# Patient Record
Sex: Female | Born: 1975 | Race: White | Hispanic: No | Marital: Married | Smoking: Former smoker
Health system: Southern US, Community
[De-identification: ages and names within clinical notes are randomized; demographics above are authoritative.]

## PROBLEM LIST (undated history)

## (undated) DIAGNOSIS — H269 Unspecified cataract: Secondary | ICD-10-CM

## (undated) DIAGNOSIS — H9193 Unspecified hearing loss, bilateral: Secondary | ICD-10-CM

## (undated) DIAGNOSIS — R011 Cardiac murmur, unspecified: Secondary | ICD-10-CM

## (undated) DIAGNOSIS — H547 Unspecified visual loss: Secondary | ICD-10-CM

## (undated) DIAGNOSIS — H409 Unspecified glaucoma: Secondary | ICD-10-CM

## (undated) DIAGNOSIS — Q150 Congenital glaucoma: Secondary | ICD-10-CM

## (undated) HISTORY — PX: EYE SURGERY: SHX253

## (undated) HISTORY — PX: HERNIA REPAIR: SHX51

## (undated) HISTORY — DX: Unspecified glaucoma: H40.9

## (undated) HISTORY — DX: Unspecified cataract: H26.9

## (undated) HISTORY — DX: Cardiac murmur, unspecified: R01.1

## (undated) HISTORY — DX: Congenital glaucoma: Q15.0

---

## 2000-03-13 ENCOUNTER — Encounter: Admission: RE | Admit: 2000-03-13 | Discharge: 2000-03-13 | Payer: Self-pay | Admitting: Family Medicine

## 2000-04-02 ENCOUNTER — Other Ambulatory Visit: Admission: RE | Admit: 2000-04-02 | Discharge: 2000-04-02 | Payer: Self-pay | Admitting: *Deleted

## 2000-04-02 ENCOUNTER — Encounter: Admission: RE | Admit: 2000-04-02 | Discharge: 2000-04-02 | Payer: Self-pay | Admitting: Family Medicine

## 2000-04-24 ENCOUNTER — Encounter: Payer: Self-pay | Admitting: Sports Medicine

## 2000-04-24 ENCOUNTER — Encounter: Admission: RE | Admit: 2000-04-24 | Discharge: 2000-04-24 | Payer: Self-pay | Admitting: Sports Medicine

## 2000-04-29 ENCOUNTER — Encounter: Admission: RE | Admit: 2000-04-29 | Discharge: 2000-04-29 | Payer: Self-pay | Admitting: Sports Medicine

## 2000-05-15 ENCOUNTER — Ambulatory Visit (HOSPITAL_COMMUNITY): Admission: RE | Admit: 2000-05-15 | Discharge: 2000-05-15 | Payer: Self-pay | Admitting: Sports Medicine

## 2000-05-22 ENCOUNTER — Encounter: Admission: RE | Admit: 2000-05-22 | Discharge: 2000-05-22 | Payer: Self-pay | Admitting: Family Medicine

## 2000-06-04 ENCOUNTER — Encounter: Admission: RE | Admit: 2000-06-04 | Discharge: 2000-06-04 | Payer: Self-pay | Admitting: Family Medicine

## 2002-09-24 ENCOUNTER — Encounter: Admission: RE | Admit: 2002-09-24 | Discharge: 2002-09-24 | Payer: Self-pay | Admitting: Family Medicine

## 2003-12-28 ENCOUNTER — Ambulatory Visit: Payer: Self-pay | Admitting: Family Medicine

## 2004-01-04 ENCOUNTER — Ambulatory Visit: Payer: Self-pay | Admitting: Family Medicine

## 2006-05-22 DIAGNOSIS — I341 Nonrheumatic mitral (valve) prolapse: Secondary | ICD-10-CM | POA: Insufficient documentation

## 2006-05-22 DIAGNOSIS — I059 Rheumatic mitral valve disease, unspecified: Secondary | ICD-10-CM | POA: Insufficient documentation

## 2006-05-22 DIAGNOSIS — H269 Unspecified cataract: Secondary | ICD-10-CM

## 2006-05-22 DIAGNOSIS — I359 Nonrheumatic aortic valve disorder, unspecified: Secondary | ICD-10-CM | POA: Insufficient documentation

## 2006-05-22 DIAGNOSIS — H9190 Unspecified hearing loss, unspecified ear: Secondary | ICD-10-CM | POA: Insufficient documentation

## 2006-05-22 DIAGNOSIS — I459 Conduction disorder, unspecified: Secondary | ICD-10-CM | POA: Insufficient documentation

## 2006-05-22 DIAGNOSIS — H409 Unspecified glaucoma: Secondary | ICD-10-CM | POA: Insufficient documentation

## 2006-05-23 ENCOUNTER — Encounter (INDEPENDENT_AMBULATORY_CARE_PROVIDER_SITE_OTHER): Payer: Self-pay | Admitting: *Deleted

## 2006-12-24 ENCOUNTER — Encounter (INDEPENDENT_AMBULATORY_CARE_PROVIDER_SITE_OTHER): Payer: Self-pay | Admitting: *Deleted

## 2006-12-24 LAB — CONVERTED CEMR LAB

## 2019-07-16 ENCOUNTER — Ambulatory Visit (INDEPENDENT_AMBULATORY_CARE_PROVIDER_SITE_OTHER): Payer: Medicare Other | Admitting: Family Medicine

## 2019-07-16 ENCOUNTER — Encounter: Payer: Self-pay | Admitting: Family Medicine

## 2019-07-16 ENCOUNTER — Other Ambulatory Visit: Payer: Self-pay

## 2019-07-16 VITALS — BP 123/79 | HR 71 | Temp 97.7°F | Ht 64.0 in | Wt 190.0 lb

## 2019-07-16 DIAGNOSIS — B078 Other viral warts: Secondary | ICD-10-CM

## 2019-07-16 DIAGNOSIS — Z8742 Personal history of other diseases of the female genital tract: Secondary | ICD-10-CM

## 2019-07-16 NOTE — Patient Instructions (Addendum)
I will refer you to obgyn.   Nice meeting you today.    Cryosurgery for Skin Conditions Cryosurgery, also called cryotherapy, is the use of extremely cold liquid (liquid nitrogen) to freeze and remove abnormal or diseased tissue. Cryosurgery may be used to remove certain growths on the skin, such as:  Warts.  Skin sores that could turn into cancer (precancerous skin lesions or actinic keratoses).  Some skin cancers. Cryosurgery usually takes a few minutes, and it can be done in your health care provider's office. Tell a health care provider about:  Any allergies you have.  All medicines you are taking, including vitamins, herbs, eye drops, creams, and over-the-counter medicines.  Any problems you or family members have had with anesthetic medicines.  Any blood disorders you have.  Any surgeries you have had.  Any medical conditions you have.  Whether you are pregnant or may be pregnant. What are the risks? Generally, this is a safe procedure. However, problems may occur, including:  Infection.  Bleeding.  Scarring.  Changes in skin color (lighter or darker than normal skin tone).  Swelling.  Hair loss in the treated area.  Damage to nearby structures or organs, such as nerve damage and loss of feeling. This is rare. What happens before the procedure? No specific preparation is needed for this procedure. Your health care provider will describe the procedure and will discuss the benefits and risks of the procedure with you. What happens during the procedure?   Your procedure will be performed using one of the following methods: ? Your health care provider may apply a device (probe) to the skin. The probe has liquid nitrogen flowing through it to cool it down. The probe will be applied to the skin until the skin is frozen and destroyed. ? Your health care provider may apply liquid nitrogen to the skin with a swab or by spraying it on the skin until the skin is frozen  and destroyed.  The treated area may be covered with a bandage (dressing). These procedures may vary among health care providers and clinics. What can I expect after procedure? After your procedure, it is common to have redness, swelling, and a blister that forms over the treated area. The blister may contain a small amount of blood. You may also have some mild stinging or a burning sensation that will resolve. If a blister forms, it will break open on its own after about 2-4 weeks, leaving a scab. Then the treated area will heal. After healing, there is usually little or no scarring. Follow these instructions at home: Caring for the treated area   Follow instructions from your health care provider about how to take care of the treated area. If you have a dressing, make sure you: ? Wash your hands with soap and water for at least 20 seconds before and after you change your dressing. If soap and water are not available, use hand sanitizer. ? Change your dressing as told by your health care provider. ? Keep the dressing and the treated area clean and dry. If the dressing gets wet, change it right away. ? Clean the treated area with soap and water. ? Keep the area covered with a dressing until it heals, or for as long as told by your health care provider.  Check the treated area every day for signs of infection. Check for: ? More redness, swelling, or pain. ? More fluid or blood. ? Warmth. ? Pus or a bad smell.  If  a blister forms, do not pick at your blister or try to break it open. Doing this can cause infection and scarring.  Do not apply any medicine, cream, or lotion to the treated area unless directed by your health care provider. General instructions  Take over-the-counter and prescription medicines only as told by your health care provider.  Do not use any products that contain nicotine or tobacco, such as cigarettes, e-cigarettes, and chewing tobacco. These can delay healing. If  you need help quitting, ask your health care provider.  Do not take baths, swim, use a hot tub, hand-wash dishes, or otherwise soak the treated area until your health care provider approves. Ask your health care provider if you may take showers. You may only be allowed to take sponge baths.  Keep all follow-up visits as told by your health care provider. This is important. Contact a health care provider if:  You have more redness, swelling, or pain around the treated area.  You have more fluid or blood coming from the treated area.  The treated area feels warm to the touch.  You have pus or a bad smell coming from the treated area.  Your blister becomes large and painful. Get help right away if:  You have a fever and have redness spreading from the treated area. Summary  Cryosurgery, also called cryotherapy, is the use of extreme cold (liquid nitrogen) to freeze and remove abnormal growths or diseased tissue.  Cryosurgery usually takes a few minutes, and it can be done in your health care provider's office.  Generally, this is a safe procedure that requires no specific preparation beforehand.  There are two different methods for performing cryosurgery. One method involves using a device (probe) to freeze the growth, and the other method involves applying liquid nitrogen directly to the growth.  After treatment with cryotherapy, follow care instructions as provided by your health care provider. Watch for signs of infection. If a blister forms, do not pick at it or try to break it open. This information is not intended to replace advice given to you by your health care provider. Make sure you discuss any questions you have with your health care provider. Document Revised: 10/28/2018 Document Reviewed: 10/28/2018 Elsevier Patient Education  [employer].     If you have lab work done today you will be contacted with your lab results within the next 2 weeks.  If you have not  heard from [place] then please contact us. The fastest way to get your results is to register for My Chart.   IF you received an x-ray today, you will receive an invoice from [facility]. Please contact [facility] at [phone] with questions or concerns regarding your invoice.   IF you received labwork today, you will receive an invoice from [place]. Please contact [facility] at [phone] with questions or concerns regarding your invoice.   Our billing staff will not be able to assist you with questions regarding bills from these companies.  You will be contacted with the lab results as soon as they are available. The fastest way to get your results is to activate your My Chart account. Instructions are located on the last page of this paperwork. If you have not heard from [place] regarding the results in 2 weeks, please contact this office.

## 2019-07-16 NOTE — Progress Notes (Signed)
Subjective:  Patient ID: Melissa Grant, female    DOB: 04-05-1975  Age: 44 y.o. MRN: [id number]  Here with tactile ASL interpreter. Visually impaired, deaf.   CC:  Chief Complaint  Patient presents with  . Establish Care    pt states as far as general health she feels fine.  . fertility    pt would like some medication to help conceve.  . Mass    pt has awart on her middle finger on her right hand. she would like removed or medication to remove it. pt has had 3 in office treatment to remove it in the past, but it keeps coming back     HPI Melissa Grant presents for   Conception counseling: Has 2 children in past, both with use of metformin. Possible PCOS. [id number] - 2006 and 2009.  Irregular periods for years, then periods more regular after breastfeeding 2nd child. Variable time between cycles - 20 or so days between cycles. Unable to conceive with current boyfriend. Has been trying since past fall - about 8 months.  LMP 07/04/19, lasted 3-5 days. No recent OBGYN.  Wart of R hand: Last treated mid December with cryotherapy when living in [place].  Decreased in size but not completely resolved.   History Patient Active Problem List   Diagnosis Date Noted  . GLAUCOMA 05/22/2006  . CATARACT 05/22/2006  . HEARING LOSS NOS OR DEAFNESS 05/22/2006  . MITRAL VALVE PROLAPSE 05/22/2006  . AORTIC VALVE INSUFFICIENCY 05/22/2006  . HEART BLOCK 05/22/2006   Past Medical History:  Diagnosis Date  . Cataract   . Glaucoma   . Heart murmur    Past Surgical History:  Procedure Laterality Date  . EYE SURGERY    . EYE SURGERY    . EYE SURGERY    . EYE SURGERY    . EYE SURGERY    . EYE SURGERY    . EYE SURGERY    . EYE SURGERY     Allergies  Allergen Reactions  . Alphagan [Brimonidine] Itching, Swelling and Other (See Comments)    Watery eyes   Prior to Admission medications   Medication Sig Start Date End Date Taking? Authorizing Provider  bimatoprost (LUMIGAN) 0.03 %  ophthalmic solution 1 drop at bedtime.   Yes [provider]  dorzolamide-timolol (COSOPT) 22.3-6.8 MG/ML ophthalmic solution 1 drop 2 (two) times daily.   Yes [provider]  pilocarpine (PILOCAR) 1 % ophthalmic solution 1 drop 4 (four) times daily.   Yes [provider]   Social History   Socioeconomic History  . Marital status: Divorced    Spouse name: Not on file  . Number of children: Not on file  . Years of education: Not on file  . Highest education level: Not on file  Occupational History  . Not on file  Tobacco Use  . Smoking status: Never Smoker  . Smokeless tobacco: Former Network engineer and Sexual Activity  . Alcohol use: Yes    Alcohol/week: 3.0 standard drinks    Types: 3 Glasses of wine per week    Comment: pt drinks rarely, but when she dose normaly has 3 sevings  . Drug use: Yes    Types: Marijuana  . Sexual activity: Not Currently  Other Topics Concern  . Not on file  Social History Narrative  . Not on file   Social Determinants of Health   Financial Resource Strain:   . Difficulty of Paying Living Expenses:   Food Insecurity:   .  Worried About Charity fundraiser in the Last Year:   . Arboriculturist in the Last Year:   Transportation Needs:   . Film/video editor (Medical):   Marland Kitchen Lack of Transportation (Non-Medical):   Physical Activity:   . Days of Exercise per Week:   . Minutes of Exercise per Session:   Stress:   . Feeling of Stress :   Social Connections:   . Frequency of Communication with Friends and Family:   . Frequency of Social Gatherings with Friends and Family:   . Attends Religious Services:   . Active Member of Clubs or Organizations:   . Attends Archivist Meetings:   Marland Kitchen Marital Status:   Intimate Partner Violence:   . Fear of Current or Ex-Partner:   . Emotionally Abused:   Marland Kitchen Physically Abused:   . Sexually Abused:     Review of Systems  Per HPI Objective:   Vitals:   07/16/19  1530  BP: 123/79  Pulse: 71  Temp: 97.7 F (36.5 C)  TempSrc: Temporal  SpO2: 95%  Weight: 190 lb (86.2 kg)  Height: 5' 4"  (1.626 m)     Physical Exam Vitals reviewed.  Constitutional:      General: She is not in acute distress.    Appearance: She is well-developed.  HENT:     Head: Normocephalic and atraumatic.  Cardiovascular:     Rate and Rhythm: Normal rate.  Pulmonary:     Effort: Pulmonary effort is normal.  Skin:    Comments: Small verrucal lesion, 73m lateral distal R 3rd finger.   Neurological:     Mental Status: She is alert and oriented to person, place, and time.   Risks (including but not limited to scarring and infection), benefits, and alternatives discussed for liquid nitrogen to R middle finger. Verbal consent obtained after any questions were answered.  3 freeze thaw cycles completed with liquid nitrogen, freeze ball to just outside of affected lesion approximately by 1 mm.  No complications.  Aftercare and RTC precautions given.   Assessment & Plan:  CBryttani Grant a 44y.o. female . History of PCOS - Plan: Ambulatory referral to Obstetrics / Gynecology  -Reported history of polycystic ovarian syndrome, some resumption of normal menses reported, but difficulty with conception since fall of last year.  Will refer her to OB/GYN to discuss fertility concerns as well as discussion of PCOS.  Common wart   -Treatment options discussed, elected to have repeat cryotherapy, treatment as above without complications, RTC precautions given if persistent lesion.  Recheck in 6 weeks to continue establish care along with some likely fasting labs at that time.  No orders of the defined types were placed in this encounter.  Patient Instructions   I will refer you to obgyn.   Nice meeting you today.    Cryosurgery for Skin Conditions Cryosurgery, also called cryotherapy, is the use of extremely cold liquid (liquid nitrogen) to freeze and remove abnormal or  diseased tissue. Cryosurgery may be used to remove certain growths on the skin, such as:  Warts.  Skin sores that could turn into cancer (precancerous skin lesions or actinic keratoses).  Some skin cancers. Cryosurgery usually takes a few minutes, and it can be done in your health care provider's office. Tell a health care provider about:  Any allergies you have.  All medicines you are taking, including vitamins, herbs, eye drops, creams, and over-the-counter medicines.  Any problems you or family members  have had with anesthetic medicines.  Any blood disorders you have.  Any surgeries you have had.  Any medical conditions you have.  Whether you are pregnant or may be pregnant. What are the risks? Generally, this is a safe procedure. However, problems may occur, including:  Infection.  Bleeding.  Scarring.  Changes in skin color (lighter or darker than normal skin tone).  Swelling.  Hair loss in the treated area.  Damage to nearby structures or organs, such as nerve damage and loss of feeling. This is rare. What happens before the procedure? No specific preparation is needed for this procedure. Your health care provider will describe the procedure and will discuss the benefits and risks of the procedure with you. What happens during the procedure?   Your procedure will be performed using one of the following methods: ? Your health care provider may apply a device (probe) to the skin. The probe has liquid nitrogen flowing through it to cool it down. The probe will be applied to the skin until the skin is frozen and destroyed. ? Your health care provider may apply liquid nitrogen to the skin with a swab or by spraying it on the skin until the skin is frozen and destroyed.  The treated area may be covered with a bandage (dressing). These procedures may vary among health care providers and clinics. What can I expect after procedure? After your procedure, it is common to  have redness, swelling, and a blister that forms over the treated area. The blister may contain a small amount of blood. You may also have some mild stinging or a burning sensation that will resolve. If a blister forms, it will break open on its own after about 2-4 weeks, leaving a scab. Then the treated area will heal. After healing, there is usually little or no scarring. Follow these instructions at home: Caring for the treated area   Follow instructions from your health care provider about how to take care of the treated area. If you have a dressing, make sure you: ? Wash your hands with soap and water for at least 20 seconds before and after you change your dressing. If soap and water are not available, use hand sanitizer. ? Change your dressing as told by your health care provider. ? Keep the dressing and the treated area clean and dry. If the dressing gets wet, change it right away. ? Clean the treated area with soap and water. ? Keep the area covered with a dressing until it heals, or for as long as told by your health care provider.  Check the treated area every day for signs of infection. Check for: ? More redness, swelling, or pain. ? More fluid or blood. ? Warmth. ? Pus or a bad smell.  If a blister forms, do not pick at your blister or try to break it open. Doing this can cause infection and scarring.  Do not apply any medicine, cream, or lotion to the treated area unless directed by your health care provider. General instructions  Take over-the-counter and prescription medicines only as told by your health care provider.  Do not use any products that contain nicotine or tobacco, such as cigarettes, e-cigarettes, and chewing tobacco. These can delay healing. If you need help quitting, ask your health care provider.  Do not take baths, swim, use a hot tub, hand-wash dishes, or otherwise soak the treated area until your health care provider approves. Ask your health care  provider if you may  take showers. You may only be allowed to take sponge baths.  Keep all follow-up visits as told by your health care provider. This is important. Contact a health care provider if:  You have more redness, swelling, or pain around the treated area.  You have more fluid or blood coming from the treated area.  The treated area feels warm to the touch.  You have pus or a bad smell coming from the treated area.  Your blister becomes large and painful. Get help right away if:  You have a fever and have redness spreading from the treated area. Summary  Cryosurgery, also called cryotherapy, is the use of extreme cold (liquid nitrogen) to freeze and remove abnormal growths or diseased tissue.  Cryosurgery usually takes a few minutes, and it can be done in your health care provider's office.  Generally, this is a safe procedure that requires no specific preparation beforehand.  There are two different methods for performing cryosurgery. One method involves using a device (probe) to freeze the growth, and the other method involves applying liquid nitrogen directly to the growth.  After treatment with cryotherapy, follow care instructions as provided by your health care provider. Watch for signs of infection. If a blister forms, do not pick at it or try to break it open. This information is not intended to replace advice given to you by your health care provider. Make sure you discuss any questions you have with your health care provider. Document Revised: 10/28/2018 Document Reviewed: 10/28/2018 Elsevier Patient Education  [employer].     If you have lab work done today you will be contacted with your lab results within the next 2 weeks.  If you have not heard from [place] then please contact us. The fastest way to get your results is to register for My Chart.   IF you received an x-ray today, you will receive an invoice from [facility]. Please contact  [facility] at [phone] with questions or concerns regarding your invoice.   IF you received labwork today, you will receive an invoice from [place]. Please contact [facility] at [phone] with questions or concerns regarding your invoice.   Our billing staff will not be able to assist you with questions regarding bills from these companies.  You will be contacted with the lab results as soon as they are available. The fastest way to get your results is to activate your My Chart account. Instructions are located on the last page of this paperwork. If you have not heard from [place] regarding the results in 2 weeks, please contact this office.         Signed, Merri Ray, MD [facility]

## 2019-07-17 ENCOUNTER — Encounter: Payer: Self-pay | Admitting: Family Medicine

## 2019-08-27 ENCOUNTER — Encounter: Payer: Self-pay | Admitting: Family Medicine

## 2019-08-27 ENCOUNTER — Other Ambulatory Visit: Payer: Self-pay

## 2019-08-27 ENCOUNTER — Ambulatory Visit (INDEPENDENT_AMBULATORY_CARE_PROVIDER_SITE_OTHER): Payer: Medicare Other | Admitting: Family Medicine

## 2019-08-27 VITALS — BP 123/79 | HR 70 | Temp 98.3°F | Ht 64.0 in | Wt 192.0 lb

## 2019-08-27 DIAGNOSIS — Z131 Encounter for screening for diabetes mellitus: Secondary | ICD-10-CM | POA: Diagnosis not present

## 2019-08-27 DIAGNOSIS — B078 Other viral warts: Secondary | ICD-10-CM

## 2019-08-27 DIAGNOSIS — R011 Cardiac murmur, unspecified: Secondary | ICD-10-CM | POA: Diagnosis not present

## 2019-08-27 DIAGNOSIS — Z8742 Personal history of other diseases of the female genital tract: Secondary | ICD-10-CM | POA: Diagnosis not present

## 2019-08-27 DIAGNOSIS — Z6832 Body mass index (BMI) 32.0-32.9, adult: Secondary | ICD-10-CM

## 2019-08-27 DIAGNOSIS — E669 Obesity, unspecified: Secondary | ICD-10-CM

## 2019-08-27 DIAGNOSIS — Z13220 Encounter for screening for lipoid disorders: Secondary | ICD-10-CM

## 2019-08-27 NOTE — Progress Notes (Signed)
Subjective:  Patient ID: Melissa Grant, female    DOB: [birth year]  Age: 44 y.o. MRN: [id number]  CC:  Chief Complaint  Patient presents with  . Follow-up    on finger from 6 weeks ago. pt states no change to the site. pt states the wart is still there. no pain at the site. pt states no one has reachout to her about the referral we put in to an obgyn on 07/16/2019  . Establish Care    as far as general health pt reports she feels great. pt  is still taking her medication as prescribed.   Presents with tactile sign language interpreter.  HPI Melissa Grant presents for   Follow-up of common wart on distal right third finger Cryotherapy with 3 freeze thaw cycles April 23. Still feels wart. Not painful, just there.   Continue to establish care visit.  New patient to me April 23.  Referred to OB/GYN at that time to discuss fertility concerns as well as previous history of PCOS. Has not received call from OBGYN yet.  No recent fasting labs. Last ate about 5-6 hours ago.   Had ophthalmology in [place], has not arranged appt here yet. Trying to get back to prior specialist.  Still using eye gtts. Does not need refills yet.   History of heart murmur. Told had mitral valve prolapse..  Had intermittent follow up in [place] with cardiologist, but no apparent concerns.   History Patient Active Problem List   Diagnosis Date Noted  . GLAUCOMA 05/22/2006  . CATARACT 05/22/2006  . HEARING LOSS NOS OR DEAFNESS 05/22/2006  . MITRAL VALVE PROLAPSE 05/22/2006  . AORTIC VALVE INSUFFICIENCY 05/22/2006  . HEART BLOCK 05/22/2006   Past Medical History:  Diagnosis Date  . Cataract   . Glaucoma   . Heart murmur    Past Surgical History:  Procedure Laterality Date  . EYE SURGERY    . EYE SURGERY    . EYE SURGERY    . EYE SURGERY    . EYE SURGERY    . EYE SURGERY    . EYE SURGERY    . EYE SURGERY     Allergies  Allergen Reactions  . Alphagan [Brimonidine] Itching, Swelling and Other  (See Comments)    Watery eyes   Prior to Admission medications   Medication Sig Start Date End Date Taking? Authorizing Provider  bimatoprost (LUMIGAN) 0.03 % ophthalmic solution 1 drop at bedtime.   Yes [provider]  dorzolamide-timolol (COSOPT) 22.3-6.8 MG/ML ophthalmic solution 1 drop 2 (two) times daily.   Yes [provider]  pilocarpine (PILOCAR) 1 % ophthalmic solution 1 drop 4 (four) times daily.   Yes [provider]   Social History   Socioeconomic History  . Marital status: Divorced    Spouse name: Not on file  . Number of children: Not on file  . Years of education: Not on file  . Highest education level: Not on file  Occupational History  . Not on file  Tobacco Use  . Smoking status: Never Smoker  . Smokeless tobacco: Former Network engineer and Sexual Activity  . Alcohol use: Yes    Alcohol/week: 3.0 standard drinks    Types: 3 Glasses of wine per week    Comment: pt drinks rarely, but when she dose normaly has 3 sevings  . Drug use: Yes    Types: Marijuana  . Sexual activity: Not Currently  Other Topics Concern  . Not on file  Social History Narrative  . Not on file   Social Determinants of Health   Financial Resource Strain:   . Difficulty of Paying Living Expenses:   Food Insecurity:   . Worried About Charity fundraiser in the Last Year:   . Arboriculturist in the Last Year:   Transportation Needs:   . Film/video editor (Medical):   Marland Kitchen Lack of Transportation (Non-Medical):   Physical Activity:   . Days of Exercise per Week:   . Minutes of Exercise per Session:   Stress:   . Feeling of Stress :   Social Connections:   . Frequency of Communication with Friends and Family:   . Frequency of Social Gatherings with Friends and Family:   . Attends Religious Services:   . Active Member of Clubs or Organizations:   . Attends Archivist Meetings:   Marland Kitchen Marital Status:   Intimate Partner Violence:   . Fear of  Current or Ex-Partner:   . Emotionally Abused:   Marland Kitchen Physically Abused:   . Sexually Abused:     Review of Systems Per HPI.   Objective:   Vitals:   08/27/19 1510  BP: 123/79  Pulse: 70  Temp: 98.3 F (36.8 C)  TempSrc: Temporal  SpO2: 94%  Weight: 192 lb (87.1 kg)  Height: 5' 4"  (1.626 m)    Physical Exam Vitals reviewed.  Constitutional:      Appearance: She is well-developed.  HENT:     Head: Normocephalic and atraumatic.  Eyes:     Conjunctiva/sclera: Conjunctivae normal.     Pupils: Pupils are equal, round, and reactive to light.  Neck:     Vascular: No carotid bruit.  Cardiovascular:     Rate and Rhythm: Normal rate and regular rhythm.     Heart sounds: Murmur (2/6 systolic with slight midsystolic click. ) present.  Pulmonary:     Effort: Pulmonary effort is normal.     Breath sounds: Normal breath sounds.  Abdominal:     Palpations: Abdomen is soft. There is no pulsatile mass.  Musculoskeletal:     Right lower leg: No edema.     Left lower leg: No edema.  Skin:    General: Skin is warm and dry.     Comments: 2 mm small verrucal lesion at the distal aspect, lateral right third finger. No erythema.  Nontender.  Neurological:     Mental Status: She is alert and oriented to person, place, and time.  Psychiatric:        Mood and Affect: Mood normal.        Behavior: Behavior normal.      Risks (including but not limited to bleeding and infection), benefits, and alternatives discussed for cryotherapy to skin wart. consent obtained after any questions were answered with use of intrepreter.  3 freeze/thaw cycles using liquid nitrogen, freeze ball approximately 2 mm outside of lesion.  No complications.  Aftercare discussed.  RTC precautions discussed  Assessment & Plan:  Melissa Grant is a 44 y.o. female . Common wart  - repeat treatment as above with rtc precautions.   History of PCOS  - follow up with obgyn. Will check referral status.   Heart  murmur  - MVP by hx - asymptomatic - obtain cardiology records.   BMI 32.0-32.9,adult Class 1 obesity without serious comorbidity with body mass index (BMI) of 32.0 to 32.9 in adult, unspecified obesity type Screening for diabetes mellitus - Plan: Comprehensive metabolic panel  Screening for hyperlipidemia - Plan: Lipid panel   No orders of the defined types were placed in this encounter.  Patient Instructions       If you have lab work done today you will be contacted with your lab results within the next 2 weeks.  If you have not heard from [place] then please contact us. The fastest way to get your results is to register for My Chart.   IF you received an x-ray today, you will receive an invoice from [facility]. Please contact [facility] at [phone] with questions or concerns regarding your invoice.   IF you received labwork today, you will receive an invoice from [place]. Please contact [facility] at [phone] with questions or concerns regarding your invoice.   Our billing staff will not be able to assist you with questions regarding bills from these companies.  You will be contacted with the lab results as soon as they are available. The fastest way to get your results is to activate your My Chart account. Instructions are located on the last page of this paperwork. If you have not heard from [place] regarding the results in 2 weeks, please contact this office.    I will check the status of OBGYN referral.    Cryosurgery for Skin Conditions, Care After These instructions give you information on caring for yourself after your procedure. Your doctor may also give you more specific instructions. Call your doctor if you have any problems or questions after your procedure. Follow these instructions at home: Caring for the treated area   Follow instructions from your doctor about how to take care of the treated area. Make sure you: ? Keep the area covered with  a bandage (dressing) until it heals, or for as long as told by your doctor. ? Wash your hands with soap and water before you change your bandage. If you do not have soap and water, use hand sanitizer. ? Change your bandage as told by your doctor. ? Keep the bandage and the treated area clean and dry. If the bandage gets wet, change it right away. ? Clean the treated area with soap and water.  Check the treated area every day for signs of infection. Check for: ? More redness, swelling, or pain. ? More fluid or blood. ? Warmth. ? Pus or a bad smell. General instructions  Do not pick at your blister. Do not try to break it open. This can cause infection and scarring.  Do not put any medicine, cream, or lotion on the treated area unless told by your doctor.  Take over-the-counter and prescription medicines only as told by your doctor.  Keep all follow-up visits as told by your doctor. This is important. Contact a doctor if:  You have more redness, swelling, or pain around the treated area.  You have more fluid or blood coming from the treated area.  The treated area feels warm to the touch.  You have pus or a bad smell coming from the treated area.  Your blister gets large and painful. Get help right away if:  You have a fever and have redness spreading from the treated area. Summary  You should keep the treated area and your bandage clean and dry.  Check the treated area every day for signs of infection. Signs include fluid, pus, warmth, or having more redness, swelling, or pain.  Do not pick at your blister. Do not try to break it open. This information is not intended to  replace advice given to you by your health care provider. Make sure you discuss any questions you have with your health care provider. Document Revised: 02/21/2017 Document Reviewed: 01/29/2016 Elsevier Patient Education  2020 [employer].      Signed, Merri Ray, MD [facility]

## 2019-08-27 NOTE — Patient Instructions (Addendum)
If you have lab work done today you will be contacted with your lab results within the next 2 weeks.  If you have not heard from [place] then please contact us. The fastest way to get your results is to register for My Chart.   IF you received an x-ray today, you will receive an invoice from [facility]. Please contact [facility] at [phone] with questions or concerns regarding your invoice.   IF you received labwork today, you will receive an invoice from [place]. Please contact [facility] at [phone] with questions or concerns regarding your invoice.   Our billing staff will not be able to assist you with questions regarding bills from these companies.  You will be contacted with the lab results as soon as they are available. The fastest way to get your results is to activate your My Chart account. Instructions are located on the last page of this paperwork. If you have not heard from [place] regarding the results in 2 weeks, please contact this office.    I will check the status of OBGYN referral.    Cryosurgery for Skin Conditions, Care After These instructions give you information on caring for yourself after your procedure. Your doctor may also give you more specific instructions. Call your doctor if you have any problems or questions after your procedure. Follow these instructions at home: Caring for the treated area   Follow instructions from your doctor about how to take care of the treated area. Make sure you: ? Keep the area covered with a bandage (dressing) until it heals, or for as long as told by your doctor. ? Wash your hands with soap and water before you change your bandage. If you do not have soap and water, use hand sanitizer. ? Change your bandage as told by your doctor. ? Keep the bandage and the treated area clean and dry. If the bandage gets wet, change it right away. ? Clean the treated area with soap and water.  Check the treated area every  day for signs of infection. Check for: ? More redness, swelling, or pain. ? More fluid or blood. ? Warmth. ? Pus or a bad smell. General instructions  Do not pick at your blister. Do not try to break it open. This can cause infection and scarring.  Do not put any medicine, cream, or lotion on the treated area unless told by your doctor.  Take over-the-counter and prescription medicines only as told by your doctor.  Keep all follow-up visits as told by your doctor. This is important. Contact a doctor if:  You have more redness, swelling, or pain around the treated area.  You have more fluid or blood coming from the treated area.  The treated area feels warm to the touch.  You have pus or a bad smell coming from the treated area.  Your blister gets large and painful. Get help right away if:  You have a fever and have redness spreading from the treated area. Summary  You should keep the treated area and your bandage clean and dry.  Check the treated area every day for signs of infection. Signs include fluid, pus, warmth, or having more redness, swelling, or pain.  Do not pick at your blister. Do not try to break it open. This information is not intended to replace advice given to you by your health care provider. Make sure you discuss any questions you have with your health care provider. Document Revised: 02/21/2017  Document Reviewed: 01/29/2016 Elsevier Patient Education  [employer].

## 2019-08-28 ENCOUNTER — Encounter: Payer: Self-pay | Admitting: Family Medicine

## 2019-08-28 LAB — COMPREHENSIVE METABOLIC PANEL
ALT: 18 IU/L (ref 0–32)
AST: 17 IU/L (ref 0–40)
Albumin/Globulin Ratio: 1.6 (ref 1.2–2.2)
Albumin: 4.5 g/dL (ref 3.8–4.8)
Alkaline Phosphatase: 83 IU/L (ref 48–121)
BUN/Creatinine Ratio: 22 (ref 9–23)
BUN: 20 mg/dL (ref 6–24)
Bilirubin Total: 0.3 mg/dL (ref 0.0–1.2)
CO2: 22 mmol/L (ref 20–29)
Calcium: 9.7 mg/dL (ref 8.7–10.2)
Chloride: 105 mmol/L (ref 96–106)
Creatinine, Ser: 0.92 mg/dL (ref 0.57–1.00)
GFR calc Af Amer: 88 mL/min/{1.73_m2} (ref >59)
GFR calc non Af Amer: 76 mL/min/{1.73_m2} (ref >59)
Globulin, Total: 2.8 g/dL (ref 1.5–4.5)
Glucose: 104 mg/dL — ABNORMAL HIGH (ref 65–99)
Potassium: 4.7 mmol/L (ref 3.5–5.2)
Sodium: 140 mmol/L (ref 134–144)
Total Protein: 7.3 g/dL (ref 6.0–8.5)

## 2019-08-28 LAB — LIPID PANEL
Chol/HDL Ratio: 3.2 ratio (ref 0.0–4.4)
Cholesterol, Total: 141 mg/dL (ref 100–199)
HDL: 44 mg/dL (ref >39)
LDL Chol Calc (NIH): 87 mg/dL (ref 0–99)
Triglycerides: 44 mg/dL (ref 0–149)
VLDL Cholesterol Cal: 10 mg/dL (ref 5–40)

## 2019-08-30 ENCOUNTER — Encounter: Payer: Self-pay | Admitting: Obstetrics & Gynecology

## 2019-09-08 ENCOUNTER — Telehealth: Payer: Self-pay | Admitting: *Deleted

## 2019-09-08 NOTE — Telephone Encounter (Signed)
Called patient this morning at 9:09 mobile number in the chart and unable to reach, no answer after several rings. I checked the HIPPA and tried to contact friend no answer. Guadelupe Sabin tried to contact patient and no answer. I checked with clerical to get the [facility] interpreter number for Deaf/Blind. I called [phone] spoke to [place] to assist and she tried the numbers and other avenues without success. Overall, patient cannot be reached to give lab results, the mailing address is temporary, per demographics. Claiborne Billings will call back if she can give further assistance.

## 2019-09-09 ENCOUNTER — Encounter: Payer: Self-pay | Admitting: Emergency Medicine

## 2019-09-09 NOTE — Progress Notes (Signed)
Unable to reach patient by phone

## 2020-05-31 ENCOUNTER — Ambulatory Visit (INDEPENDENT_AMBULATORY_CARE_PROVIDER_SITE_OTHER): Payer: Medicare Other | Admitting: Family Medicine

## 2020-05-31 ENCOUNTER — Encounter: Payer: Self-pay | Admitting: Family Medicine

## 2020-05-31 ENCOUNTER — Other Ambulatory Visit: Payer: Self-pay

## 2020-05-31 VITALS — BP 123/80 | HR 74 | Temp 97.3°F | Ht 64.0 in | Wt 203.0 lb

## 2020-05-31 DIAGNOSIS — M792 Neuralgia and neuritis, unspecified: Secondary | ICD-10-CM

## 2020-05-31 DIAGNOSIS — M79601 Pain in right arm: Secondary | ICD-10-CM | POA: Diagnosis not present

## 2020-05-31 DIAGNOSIS — M25511 Pain in right shoulder: Secondary | ICD-10-CM | POA: Diagnosis not present

## 2020-05-31 NOTE — Patient Instructions (Addendum)
    If you have lab work done today you will be contacted with your lab results within the next 2 weeks.  If you have not heard from [place] then please contact us. The fastest way to get your results is to register for My Chart.   IF you received an x-ray today, you will receive an invoice from [facility]. Please contact [facility] at [phone] with questions or concerns regarding your invoice.   IF you received labwork today, you will receive an invoice from [place]. Please contact [facility] at [phone] with questions or concerns regarding your invoice.   Our billing staff will not be able to assist you with questions regarding bills from these companies.  You will be contacted with the lab results as soon as they are available. The fastest way to get your results is to activate your My Chart account. Instructions are located on the last page of this paperwork. If you have not heard from [place] regarding the results in 2 weeks, please contact this office.    I will order xray at [address] [employer] Depending on xray, may need to refer you to shoulder specialist. I will let you know. Tylenol if needed.  Let us know the best way to reach you with the results of the xray.

## 2020-05-31 NOTE — Progress Notes (Signed)
Subjective:  Patient ID: Melissa Grant, female    DOB: 01-Jun-1975  Age: 45 y.o. MRN: [id number]  CC:  Chief Complaint  Patient presents with  . Arm Pain    Pt reports pain in her R arm. PT fell about 6 months ago. Pt hurt really bad, but sub sided after a few weeks, but it comes and gos. Pt reports having issues sleeping because of the pain. Pain is always located in the upper arm. Pt reports a shooting pain throughout the whole arm every now and then.   Here with tactile sign language interpreter.  HPI Melissa Grant presents for   R arm pain: Cleaning outside house. Bumped into object in driveway. [place], caught self on hands. Felt excruciating pain in shoulder - upper arm diffusely.  No bruising (fiance examined and noted no skin changes). No evaluation.  Pain worse for a few weeks, then lightened up - still persistent pain.  No neck pain, no pain below elbow usually. diffuse pain in R upper arm from shoulder to upper arm. Few times in past 6 months feels a shooting pain down arm into hand to 5th digit only.   R hand dominant. No hand or wrist pain, or weakness.   Tx: none.    History Patient Active Problem List   Diagnosis Date Noted  . GLAUCOMA 05/22/2006  . CATARACT 05/22/2006  . HEARING LOSS NOS OR DEAFNESS 05/22/2006  . MITRAL VALVE PROLAPSE 05/22/2006  . AORTIC VALVE INSUFFICIENCY 05/22/2006  . HEART BLOCK 05/22/2006   Past Medical History:  Diagnosis Date  . Cataract   . Glaucoma   . Heart murmur    Past Surgical History:  Procedure Laterality Date  . EYE SURGERY    . EYE SURGERY    . EYE SURGERY    . EYE SURGERY    . EYE SURGERY    . EYE SURGERY    . EYE SURGERY    . EYE SURGERY     Allergies  Allergen Reactions  . Alphagan [Brimonidine] Itching, Swelling and Other (See Comments)    Watery eyes   Prior to Admission medications   Medication Sig Start Date End Date Taking? Authorizing Provider  bimatoprost (LUMIGAN) 0.03 % ophthalmic solution 1  drop at bedtime.   Yes [provider]  dorzolamide-timolol (COSOPT) 22.3-6.8 MG/ML ophthalmic solution 1 drop 2 (two) times daily.   Yes [provider]  pilocarpine (PILOCAR) 1 % ophthalmic solution 1 drop 4 (four) times daily.   Yes [provider]   Social History   Socioeconomic History  . Marital status: Divorced    Spouse name: Not on file  . Number of children: Not on file  . Years of education: Not on file  . Highest education level: Not on file  Occupational History  . Not on file  Tobacco Use  . Smoking status: Never Smoker  . Smokeless tobacco: Former Network engineer  . Vaping Use: Never used  Substance and Sexual Activity  . Alcohol use: Yes    Alcohol/week: 3.0 standard drinks    Types: 3 Glasses of wine per week    Comment: pt drinks rarely, but when she dose normaly has 3 sevings  . Drug use: Yes    Types: Marijuana  . Sexual activity: Not Currently  Other Topics Concern  . Not on file  Social History Narrative  . Not on file   Social Determinants of Health   Financial Resource Strain: Not on file  Food Insecurity: Not on file  Transportation Needs: Not on file  Physical Activity: Not on file  Stress: Not on file  Social Connections: Not on file  Intimate Partner Violence: Not on file    Review of Systems   Objective:   Vitals:   05/31/20 1637  BP: 123/80  Pulse: 74  Temp: (!) 97.3 F (36.3 C)  TempSrc: Temporal  SpO2: 100%  Weight: 203 lb (92.1 kg)  Height: 5' 4"  (1.626 m)     Physical Exam Constitutional:      General: She is not in acute distress.    Appearance: She is well-developed and well-nourished.  HENT:     Head: Normocephalic and atraumatic.  Cardiovascular:     Rate and Rhythm: Normal rate.  Pulmonary:     Effort: Pulmonary effort is normal.  Musculoskeletal:     Comments: No midline bony tenderness of C-spine, pain free c-spine range of motion, no radicular symptoms on exam.  Grip strength  intact.  Finger opposition intact, normal " okay sign" with intact strength  Right shoulder: Slight diffuse tenderness over the lateral to the anterior upper humerus.  Skin intact without apparent soft tissue swelling, no ecchymosis.  Dorsal shoulder, AC, [place], clavicle nontender. Active range of motion limited to approximately 100 degrees flexion with pain into upper arm at terminal flexion, some soreness into forearm with this testing.  Abduction approximately 90 degrees, internal rotation to SI joint.    Neurological:     General: No focal deficit present.     Mental Status: She is alert and oriented to person, place, and time.  Psychiatric:        Mood and Affect: Mood and affect normal.   Neurovascular intact distally, before and after exam.  Assessment & Plan:  Melissa Grant is a 45 y.o. female . Right arm pain - Plan: DG Humerus Right Pain in joint of right shoulder - Plan: DG Shoulder Right Radicular pain in right arm - Plan: DG Cervical Spine 2 or 3 views  -Initial injury, pain suspicious for proximal humeral fracture, shoulder fracture.  Unfortunately did not have additional imaging/evaluation.  Worst pain initial few weeks but has had persistent discomfort past 6 months, some decreased range of motion, and pain with terminal flexion.  Intermittent radicular symptoms in the C8 distribution but not persistent and no apparent weakness appreciated on exam.  -Initially we will start with plain films/x-ray of C-spine, shoulder, humerus.  Likely shoulder specialist follow-up plus or minus advanced imaging with MRI.  Symptomatic care discussed with Tylenol for now, avoid extremes of range of motion, RTC precautions.  Visit performed with tactile sign language interpreter.  Understanding expressed.  No orders of the defined types were placed in this encounter.  Patient Instructions       If you have lab work done today you will be contacted with your lab results within the next 2  weeks.  If you have not heard from [place] then please contact us. The fastest way to get your results is to register for My Chart.   IF you received an x-ray today, you will receive an invoice from [facility]. Please contact [facility] at [phone] with questions or concerns regarding your invoice.   IF you received labwork today, you will receive an invoice from [place]. Please contact [facility] at [phone] with questions or concerns regarding your invoice.   Our billing staff will not be able to assist you with questions regarding bills from these companies.  You will be contacted with the lab results as soon as they are available. The fastest way to get your results is to activate your My Chart account. Instructions are located on the last page of this paperwork. If you have not heard from [place] regarding the results in 2 weeks, please contact this office.         Signed, Merri Ray, MD [facility]

## 2020-06-01 ENCOUNTER — Ambulatory Visit
Admission: RE | Admit: 2020-06-01 | Discharge: 2020-06-01 | Disposition: A | Discharge status: Home / Self Care | Payer: Medicare Other | Source: Ambulatory Visit | Attending: Family Medicine | Admitting: Family Medicine

## 2020-06-01 DIAGNOSIS — M79601 Pain in right arm: Secondary | ICD-10-CM

## 2020-06-01 DIAGNOSIS — M792 Neuralgia and neuritis, unspecified: Secondary | ICD-10-CM

## 2020-06-01 DIAGNOSIS — M25511 Pain in right shoulder: Secondary | ICD-10-CM

## 2020-06-08 ENCOUNTER — Telehealth: Payer: Self-pay | Admitting: Family Medicine

## 2020-06-08 NOTE — Telephone Encounter (Signed)
Pt needs the results of her X ray done 3/10.  Pt has not heard back.  Incorrect phone number in chart. Number has been corrected.  cb [phone]

## 2020-06-09 NOTE — Telephone Encounter (Signed)
Called pt with number listed and no answer. Left a message with the interpreter service with pt results.

## 2021-04-02 ENCOUNTER — Ambulatory Visit (INDEPENDENT_AMBULATORY_CARE_PROVIDER_SITE_OTHER): Payer: Medicare Other | Admitting: Family Medicine

## 2021-04-02 VITALS — BP 122/64 | HR 75 | Temp 98.3°F | Resp 16 | Ht 64.0 in | Wt 206.0 lb

## 2021-04-02 DIAGNOSIS — Z131 Encounter for screening for diabetes mellitus: Secondary | ICD-10-CM

## 2021-04-02 DIAGNOSIS — H5440 Blindness, one eye, unspecified eye: Secondary | ICD-10-CM | POA: Diagnosis not present

## 2021-04-02 DIAGNOSIS — Z1211 Encounter for screening for malignant neoplasm of colon: Secondary | ICD-10-CM

## 2021-04-02 DIAGNOSIS — Z3009 Encounter for other general counseling and advice on contraception: Secondary | ICD-10-CM

## 2021-04-02 DIAGNOSIS — Z130 Encounter for screening for diseases of the blood and blood-forming organs and certain disorders involving the immune mechanism: Secondary | ICD-10-CM

## 2021-04-02 DIAGNOSIS — H409 Unspecified glaucoma: Secondary | ICD-10-CM | POA: Diagnosis not present

## 2021-04-02 DIAGNOSIS — Z0000 Encounter for general adult medical examination without abnormal findings: Secondary | ICD-10-CM | POA: Diagnosis not present

## 2021-04-02 DIAGNOSIS — I459 Conduction disorder, unspecified: Secondary | ICD-10-CM

## 2021-04-02 DIAGNOSIS — Z124 Encounter for screening for malignant neoplasm of cervix: Secondary | ICD-10-CM

## 2021-04-02 DIAGNOSIS — Z13220 Encounter for screening for lipoid disorders: Secondary | ICD-10-CM

## 2021-04-02 DIAGNOSIS — I351 Nonrheumatic aortic (valve) insufficiency: Secondary | ICD-10-CM

## 2021-04-02 NOTE — Patient Instructions (Addendum)
I will refer you to eye specialist, heart doctor and gynecologist.  Try to walk or other exercise most days per week with a goal of 163mn per week. I recommend new covid vaccine booster.  If bump on eye gets bigger or drains - return for recheck. Cologuard ordered for colon cancer screening.   Thank you for coming in today.   Preventive Care [id number] Old, Female Preventive care refers to lifestyle choices and visits with your health care provider that can promote health and wellness. Preventive care visits are also called wellness exams. What can I expect for my preventive care visit? Counseling Your health care provider may ask you questions about your: Medical history, including: Past medical problems. Family medical history. Pregnancy history. Current health, including: Menstrual cycle. Method of birth control. Emotional well-being. Home life and relationship well-being. Sexual activity and sexual health. Lifestyle, including: Alcohol, nicotine or tobacco, and drug use. Access to firearms. Diet, exercise, and sleep habits. Work and work eStatistician Sunscreen use. Safety issues such as seatbelt and bike helmet use. Physical exam Your health care provider will check your: Height and weight. These may be used to calculate your BMI (body mass index). BMI is a measurement that tells if you are at a healthy weight. Waist circumference. This measures the distance around your waistline. This measurement also tells if you are at a healthy weight and may help predict your risk of certain diseases, such as type 2 diabetes and high blood pressure. Heart rate and blood pressure. Body temperature. Skin for abnormal spots. What immunizations do I need? Vaccines are usually given at various ages, according to a schedule. Your health care provider will recommend vaccines for you based on your age, medical history, and lifestyle or other factors, such as travel or where you work. What  tests do I need? Screening Your health care provider may recommend screening tests for certain conditions. This may include: Lipid and cholesterol levels. Diabetes screening. This is done by checking your blood sugar (glucose) after you have not eaten for a while (fasting). Pelvic exam and Pap test. Hepatitis B test. Hepatitis C test. HIV (human immunodeficiency virus) test. STI (sexually transmitted infection) testing, if you are at risk. Lung cancer screening. Colorectal cancer screening. Mammogram. Talk with your health care provider about when you should start having regular mammograms. This may depend on whether you have a family history of breast cancer. BRCA-related cancer screening. This may be done if you have a family history of breast, ovarian, tubal, or peritoneal cancers. Bone density scan. This is done to screen for osteoporosis. Talk with your health care provider about your test results, treatment options, and if necessary, the need for more tests. Follow these instructions at home: Eating and drinking  Eat a diet that includes fresh fruits and vegetables, whole grains, lean protein, and low-fat dairy products. Take vitamin and mineral supplements as recommended by your health care provider. Do not drink alcohol if: Your health care provider tells you not to drink. You are pregnant, may be pregnant, or are planning to become pregnant. If you drink alcohol: Limit how much you have to 0-1 drink a day. Know how much alcohol is in your drink. In the [place] one drink equals one 12 oz bottle of beer (355 mL), one 5 oz glass of wine (148 mL), or one 1 oz glass of hard liquor (44 mL). Lifestyle Brush your teeth every morning and night with fluoride toothpaste. Floss one time each day. Exercise for  at least 30 minutes 5 or more days each week. Do not use any products that contain nicotine or tobacco. These products include cigarettes, chewing tobacco, and vaping devices, such as  e-cigarettes. If you need help quitting, ask your health care provider. Do not use drugs. If you are sexually active, practice safe sex. Use a condom or other form of protection to prevent STIs. If you do not wish to become pregnant, use a form of birth control. If you plan to become pregnant, see your health care provider for a prepregnancy visit. Take aspirin only as told by your health care provider. Make sure that you understand how much to take and what form to take. Work with your health care provider to find out whether it is safe and beneficial for you to take aspirin daily. Find healthy ways to manage stress, such as: Meditation, yoga, or listening to music. Journaling. Talking to a trusted person. Spending time with friends and family. Minimize exposure to UV radiation to reduce your risk of skin cancer. Safety Always wear your seat belt while driving or riding in a vehicle. Do not drive: If you have been drinking alcohol. Do not ride with someone who has been drinking. When you are tired or distracted. While texting. If you have been using any mind-altering substances or drugs. Wear a helmet and other protective equipment during sports activities. If you have firearms in your house, make sure you follow all gun safety procedures. Seek help if you have been physically or sexually abused. What's next? Visit your health care provider once a year for an annual wellness visit. Ask your health care provider how often you should have your eyes and teeth checked. Stay up to date on all vaccines. This information is not intended to replace advice given to you by your health care provider. Make sure you discuss any questions you have with your health care provider. Document Revised: 09/06/2020 Document Reviewed: 09/06/2020 Elsevier Patient Education  [place].

## 2021-04-02 NOTE — Progress Notes (Signed)
Subjective:  Patient ID: Melissa Grant, female    DOB: [birth year]  Age: 46 y.o. MRN: [id number]  CC:  Chief Complaint  Patient presents with   Annual Exam    Pt reports no concerns, feels well, reports small lump on Rt eye brow no pain would like to have checked for concern    HPI Melissa Grant presents for  Annual exam.  Presents with tactile sign language interpreter. Newly married.  2 children live with her ex-husband in [place] and 15yo.   R arm, shoulder pain discussed in March 2022.  Imaging of right shoulder, humerus negative. C-spine with mild to moderate multilevel cervical spondylosis with disc height loss from C3-4 through C6-7.  Option to meet with Ortho, but unable to reach and no contact since that time.  Has been doing well - back to normal. No issues recently. Rtc precautions given.   R eyebrow bump: Noted past few years. Comes and goes. Min sore at times, some drainage at times.   Family-planning: History of PCOS, difficulty with conception discussed in April 2021.  Referred to OB/GYN.  Was not able to be seen last time. Still would like to meet with Gyn.  Longer cycle, 33-39 days.. Unknown last pap - 5-7 years ago - normal.   Hx of aortica valve insufficiency,heart block by problem list. seen by cardiology in [place]. No procedures.  Was told in 2015 may need pacemaker, then not.Rare lightheadedness, no chest pain, no dyspnea. ? Echo in 2018-2019. Told was stable.   Cancer screening: Screening options with colonoscopy versus Cologuard discussed. Discussed timing of repeat testing intervals if normal, as well as potential need for diagnostic Colonoscopy if positive Cologuard. Understanding expressed, and chose Cologuard.    Immunization History  Administered Date(s) Administered   Td 08/24/1998  Flu vaccine: declines.  Covid vaccine - 3 vaccines with a booster - Dec 2021.recommended bivalent booster.    Optho: Hx of glaucoma, prior gtts. No recent  optho eval. Living with mother in law - she is ill, and overwhelming, and busy at recently married.  Last optho eval few years ago - in [place]?  No recent eye gtts.   Dental: recent visit.   Alcohol: occasional - 2 times per year.   Tobacco: none - quit many years ago.   Activity/exercise/obesity: Walking some - not 115mn.   Body mass index is 35.36 kg/m. Wt Readings from Last 3 Encounters:  04/02/21 206 lb (93.4 kg)  05/31/20 203 lb (92.1 kg)  08/27/19 192 lb (87.1 kg)       History Patient Active Problem List   Diagnosis Date Noted   GLAUCOMA 05/22/2006   CATARACT 05/22/2006   HEARING LOSS NOS OR DEAFNESS 05/22/2006   MITRAL VALVE PROLAPSE 05/22/2006   AORTIC VALVE INSUFFICIENCY 05/22/2006   HEART BLOCK 05/22/2006   Past Medical History:  Diagnosis Date   Cataract    Glaucoma    Heart murmur    Past Surgical History:  Procedure Laterality Date   EYE SURGERY     EYE SURGERY     EYE SURGERY     EYE SURGERY     EYE SURGERY     EYE SURGERY     EYE SURGERY     EYE SURGERY     Allergies  Allergen Reactions   Alphagan [Brimonidine] Itching, Swelling and Other (See Comments)    Watery eyes   Prior to Admission medications   Medication Sig Start Date End Date Taking? Authorizing Provider  bimatoprost (LUMIGAN) 0.03 % ophthalmic solution 1 drop at bedtime.    [provider]  dorzolamide-timolol (COSOPT) 22.3-6.8 MG/ML ophthalmic solution 1 drop 2 (two) times daily.    [provider]  pilocarpine (PILOCAR) 1 % ophthalmic solution 1 drop 4 (four) times daily.    [provider]   Social History   Socioeconomic History   Marital status: Divorced    Spouse name: Not on file   Number of children: Not on file   Years of education: Not on file   Highest education level: Not on file  Occupational History   Not on file  Tobacco Use   Smoking status: Never   Smokeless tobacco: Former  Scientific laboratory technician Use: Never used   Substance and Sexual Activity   Alcohol use: Yes    Alcohol/week: 3.0 standard drinks    Types: 3 Glasses of wine per week    Comment: pt drinks rarely, but when she dose normaly has 3 sevings   Drug use: Yes    Types: Marijuana   Sexual activity: Not Currently  Other Topics Concern   Not on file  Social History Narrative   Not on file   Social Determinants of Health   Financial Resource Strain: Not on file  Food Insecurity: Not on file  Transportation Needs: Not on file  Physical Activity: Not on file  Stress: Not on file  Social Connections: Not on file  Intimate Partner Violence: Not on file    Review of Systems 13 point review of systems per patient health survey noted.  Negative other than as indicated above or in HPI.    Objective:   Vitals:   04/02/21 1421  BP: 122/64  Pulse: 75  Resp: 16  Temp: 98.3 F (36.8 C)  TempSrc: Temporal  SpO2: 95%  Weight: 206 lb (93.4 kg)  Height: 5' 4"  (1.626 m)     Physical Exam Vitals reviewed.  Constitutional:      Appearance: She is well-developed.  HENT:     Head: Normocephalic and atraumatic.     Right Ear: External ear normal.     Left Ear: External ear normal.  Eyes:     Comments: Near complete whitening/opacification over R eye, marked clouding over L eye.  Small 2m flesh colored round lesion R eyebrow without redness or discharge.   Neck:     Thyroid: No thyromegaly.  Cardiovascular:     Rate and Rhythm: Normal rate and regular rhythm.     Heart sounds: Murmur (3 out of 6 systolic murmur with normal rate/rhythm.) heard.  Pulmonary:     Effort: Pulmonary effort is normal. No respiratory distress.     Breath sounds: Normal breath sounds. No wheezing.  Abdominal:     General: Bowel sounds are normal.     Palpations: Abdomen is soft.     Tenderness: There is no abdominal tenderness.  Musculoskeletal:        General: No tenderness. Normal range of motion.     Cervical back: Normal range of motion and  neck supple.     Comments: Pain-free range of motion of cervical spine, bilateral shoulders.  Lymphadenopathy:     Cervical: No cervical adenopathy.  Skin:    General: Skin is warm and dry.     Findings: No rash.  Neurological:     Mental Status: She is alert and oriented to person, place, and time.  Psychiatric:        Behavior: Behavior  normal.        Thought Content: Thought content normal.       Assessment & Plan:  Uzma Hellmer is a 46 y.o. female . Annual physical exam  - .-anticipatory guidance as below in AVS, screening labs above. Health maintenance items as above in HPI discussed/recommended as applicable.   Glaucoma, unspecified glaucoma type, unspecified laterality - Plan: Ambulatory referral to Ophthalmology Blindness of right eye, unspecified left eye visual impairment category - Plan: Ambulatory referral to Ophthalmology  -Off eyedrops, due for follow-up with ophthalmology, new referral placed locally.  Can also have area above I evaluated adenopathy but appears to be possible cyst.  Family planning counseling - Plan: Ambulatory referral to Gynecology Screening for cervical cancer - Plan: Ambulatory referral to Gynecology  -Due for Pap testing.  We will wait for testing through GYN as she would like to meet with them as well to discuss family-planning  Screening for diabetes mellitus - Plan: Comprehensive metabolic panel   Screening for hyperlipidemia - Plan: Comprehensive metabolic panel, Lipid panel  Screen for colon cancer - Plan: Cologuard  Aortic valve insufficiency, etiology of cardiac valve disease unspecified - Plan: Ambulatory referral to Cardiology Heart block - Plan: Ambulatory referral to Cardiology  -History of aortic valve insufficiency, unknown last echo.  Refer to cardiology for ongoing care and decision of updated imaging.  Regular rhythm in office.  No apparent new symptoms.  No orders of the defined types were placed in this  encounter.  Patient Instructions  I will refer you to eye specialist, heart doctor and gynecologist.  Try to walk or other exercise most days per week with a goal of 133mn per week. I recommend new covid vaccine booster.  If bump on eye gets bigger or drains - return for recheck. Cologuard ordered for colon cancer screening.   Thank you for coming in today.   Preventive Care [id number] Old, Female Preventive care refers to lifestyle choices and visits with your health care provider that can promote health and wellness. Preventive care visits are also called wellness exams. What can I expect for my preventive care visit? Counseling Your health care provider may ask you questions about your: Medical history, including: Past medical problems. Family medical history. Pregnancy history. Current health, including: Menstrual cycle. Method of birth control. Emotional well-being. Home life and relationship well-being. Sexual activity and sexual health. Lifestyle, including: Alcohol, nicotine or tobacco, and drug use. Access to firearms. Diet, exercise, and sleep habits. Work and work eStatistician Sunscreen use. Safety issues such as seatbelt and bike helmet use. Physical exam Your health care provider will check your: Height and weight. These may be used to calculate your BMI (body mass index). BMI is a measurement that tells if you are at a healthy weight. Waist circumference. This measures the distance around your waistline. This measurement also tells if you are at a healthy weight and may help predict your risk of certain diseases, such as type 2 diabetes and high blood pressure. Heart rate and blood pressure. Body temperature. Skin for abnormal spots. What immunizations do I need? Vaccines are usually given at various ages, according to a schedule. Your health care provider will recommend vaccines for you based on your age, medical history, and lifestyle or other factors, such  as travel or where you work. What tests do I need? Screening Your health care provider may recommend screening tests for certain conditions. This may include: Lipid and cholesterol levels. Diabetes screening. This is done by  checking your blood sugar (glucose) after you have not eaten for a while (fasting). Pelvic exam and Pap test. Hepatitis B test. Hepatitis C test. HIV (human immunodeficiency virus) test. STI (sexually transmitted infection) testing, if you are at risk. Lung cancer screening. Colorectal cancer screening. Mammogram. Talk with your health care provider about when you should start having regular mammograms. This may depend on whether you have a family history of breast cancer. BRCA-related cancer screening. This may be done if you have a family history of breast, ovarian, tubal, or peritoneal cancers. Bone density scan. This is done to screen for osteoporosis. Talk with your health care provider about your test results, treatment options, and if necessary, the need for more tests. Follow these instructions at home: Eating and drinking  Eat a diet that includes fresh fruits and vegetables, whole grains, lean protein, and low-fat dairy products. Take vitamin and mineral supplements as recommended by your health care provider. Do not drink alcohol if: Your health care provider tells you not to drink. You are pregnant, may be pregnant, or are planning to become pregnant. If you drink alcohol: Limit how much you have to 0-1 drink a day. Know how much alcohol is in your drink. In the [place] one drink equals one 12 oz bottle of beer (355 mL), one 5 oz glass of wine (148 mL), or one 1 oz glass of hard liquor (44 mL). Lifestyle Brush your teeth every morning and night with fluoride toothpaste. Floss one time each day. Exercise for at least 30 minutes 5 or more days each week. Do not use any products that contain nicotine or tobacco. These products include cigarettes, chewing  tobacco, and vaping devices, such as e-cigarettes. If you need help quitting, ask your health care provider. Do not use drugs. If you are sexually active, practice safe sex. Use a condom or other form of protection to prevent STIs. If you do not wish to become pregnant, use a form of birth control. If you plan to become pregnant, see your health care provider for a prepregnancy visit. Take aspirin only as told by your health care provider. Make sure that you understand how much to take and what form to take. Work with your health care provider to find out whether it is safe and beneficial for you to take aspirin daily. Find healthy ways to manage stress, such as: Meditation, yoga, or listening to music. Journaling. Talking to a trusted person. Spending time with friends and family. Minimize exposure to UV radiation to reduce your risk of skin cancer. Safety Always wear your seat belt while driving or riding in a vehicle. Do not drive: If you have been drinking alcohol. Do not ride with someone who has been drinking. When you are tired or distracted. While texting. If you have been using any mind-altering substances or drugs. Wear a helmet and other protective equipment during sports activities. If you have firearms in your house, make sure you follow all gun safety procedures. Seek help if you have been physically or sexually abused. What's next? Visit your health care provider once a year for an annual wellness visit. Ask your health care provider how often you should have your eyes and teeth checked. Stay up to date on all vaccines. This information is not intended to replace advice given to you by your health care provider. Make sure you discuss any questions you have with your health care provider. Document Revised: 09/06/2020 Document Reviewed: 09/06/2020 Elsevier Patient Education  2022 Else[employer]  Inc.         Signed,   Merri Ray, MD [place] 04/03/21 9:03 AM

## 2021-04-03 LAB — COMPREHENSIVE METABOLIC PANEL
ALT: 40 U/L — ABNORMAL HIGH (ref 0–35)
AST: 21 U/L (ref 0–37)
Albumin: 4.5 g/dL (ref 3.5–5.2)
Alkaline Phosphatase: 68 U/L (ref 39–117)
BUN: 16 mg/dL (ref 6–23)
CO2: 26 mEq/L (ref 19–32)
Calcium: 9.6 mg/dL (ref 8.4–10.5)
Chloride: 105 mEq/L (ref 96–112)
Creatinine, Ser: 0.76 mg/dL (ref 0.40–1.20)
GFR: 94.3 mL/min (ref >60.00)
Glucose, Bld: 71 mg/dL (ref 70–99)
Potassium: 4.4 mEq/L (ref 3.5–5.1)
Sodium: 137 mEq/L (ref 135–145)
Total Bilirubin: 0.4 mg/dL (ref 0.2–1.2)
Total Protein: 7 g/dL (ref 6.0–8.3)

## 2021-04-03 LAB — LIPID PANEL
Cholesterol: 146 mg/dL (ref 0–200)
HDL: 40.7 mg/dL (ref >39.00)
LDL Cholesterol: 88 mg/dL (ref 0–99)
NonHDL: 104.93
Total CHOL/HDL Ratio: 4
Triglycerides: 84 mg/dL (ref 0.0–149.0)
VLDL: 16.8 mg/dL (ref 0.0–40.0)

## 2021-06-11 LAB — COLOGUARD: COLOGUARD: NEGATIVE

## 2021-06-12 ENCOUNTER — Telehealth: Payer: Self-pay

## 2021-06-12 NOTE — Telephone Encounter (Signed)
-----   Message from Wendie Agreste, MD sent at 06/12/2021 10:11 AM EDT ----- ?Lab letter ? ?Good news.  Cologuard colon cancer screening test was negative or normal.  This test should be repeated in 3 years.  Please let me know if you have questions. ? ?Dr. Carlota Raspberry ? ?

## 2021-06-12 NOTE — Telephone Encounter (Signed)
LVM for patient to return call in regard to cologuard results ?

## 2021-06-13 NOTE — Telephone Encounter (Signed)
Attempted to contact patient, no answer ?

## 2021-06-22 ENCOUNTER — Telehealth: Payer: Self-pay

## 2021-06-22 NOTE — Telephone Encounter (Signed)
Patient is calling in wanting to know when she will need to re do her blood work or if she needs to come in for an appointment. Patient said that in Jan. They asked her to repeat blood work but never said when she needed to retest.  ?

## 2021-06-22 NOTE — Telephone Encounter (Signed)
LVM for patient to return call to schedule a lab visit for CMP ?

## 2021-07-01 IMAGING — CR DG HUMERUS 2V *R*
2 series · 2 of 2 positions shown · non-contrast
Comparison: None.

CLINICAL DATA: Chronic right arm pain.

EXAM:
RIGHT HUMERUS - 2+ VIEW

[w humerus ap right]
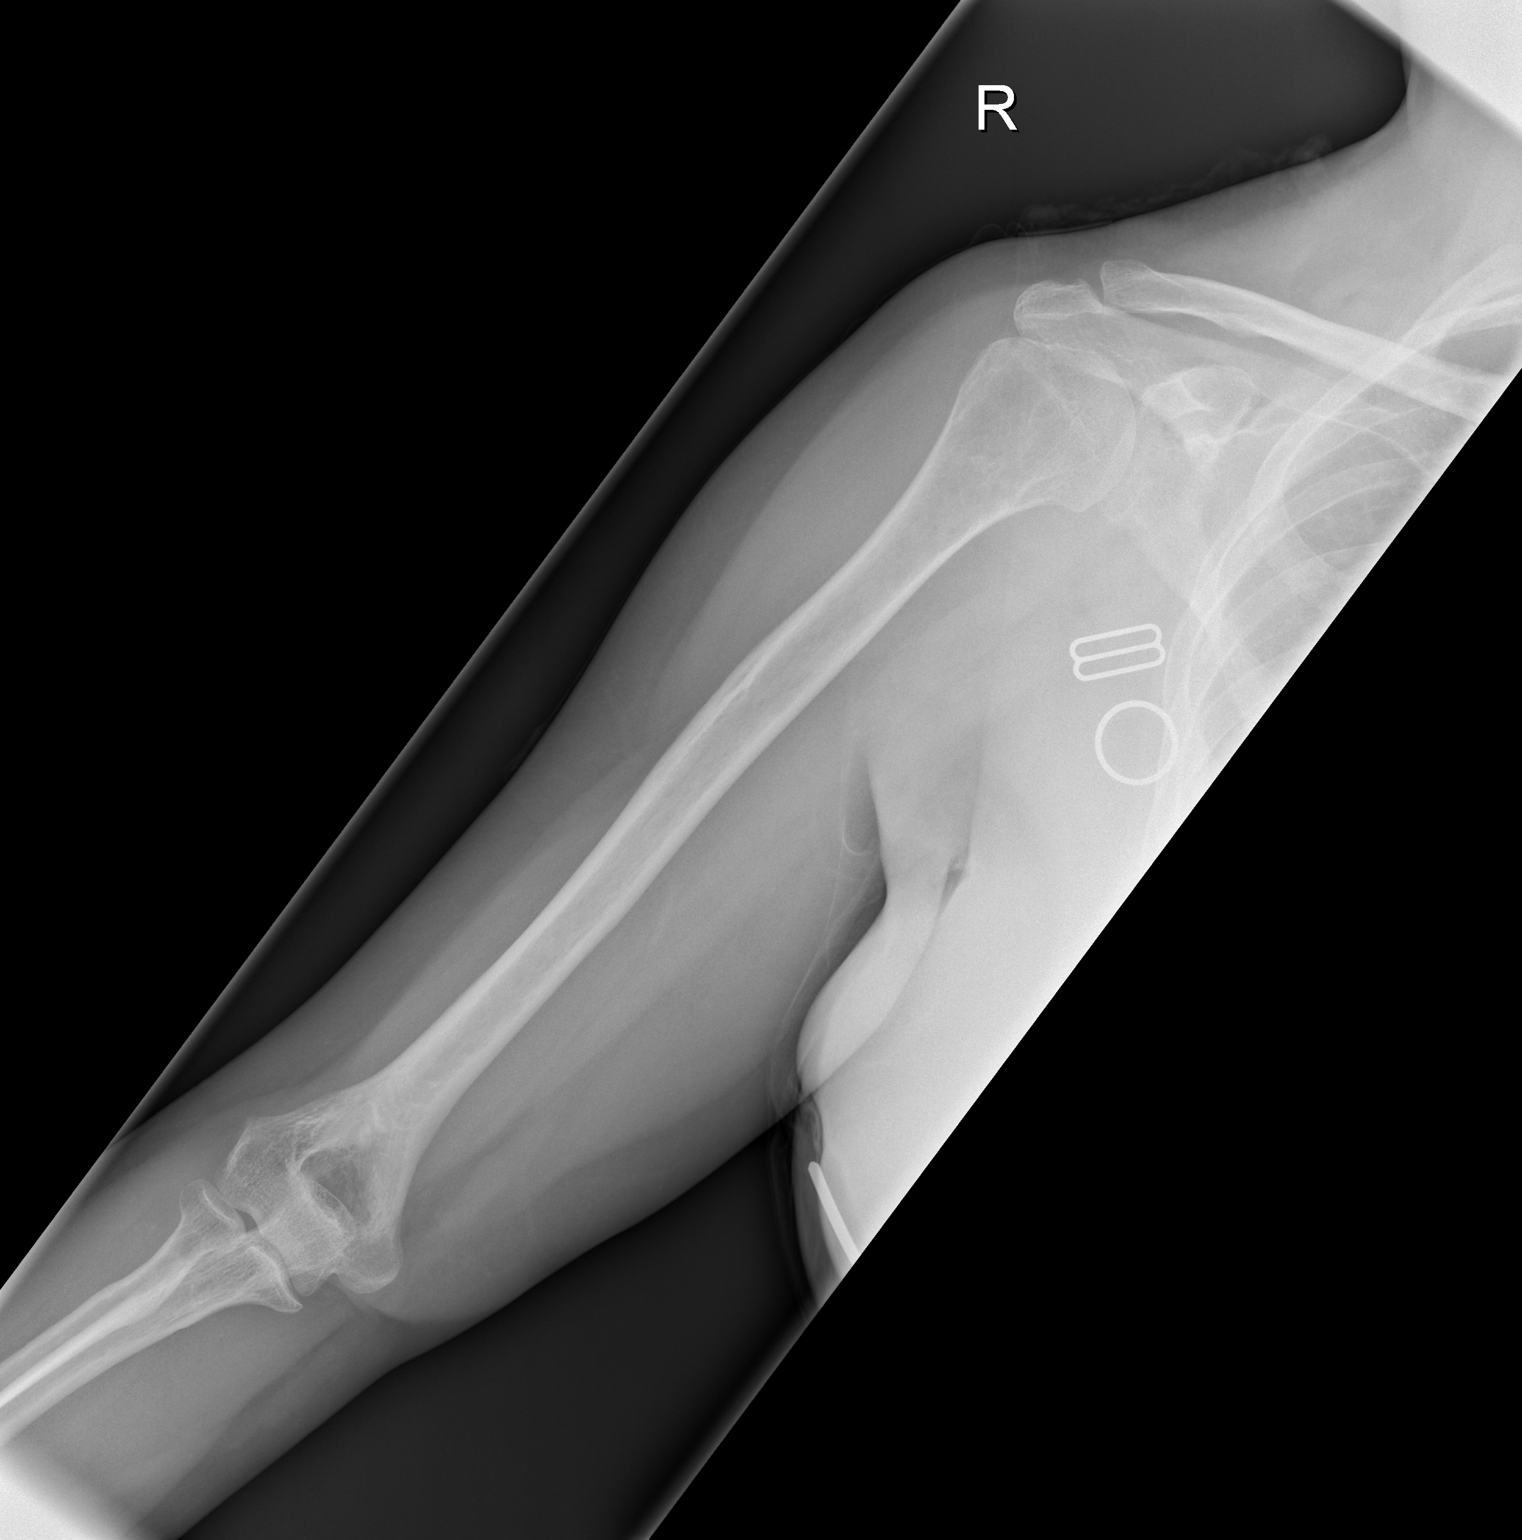

[w humerus lat right]
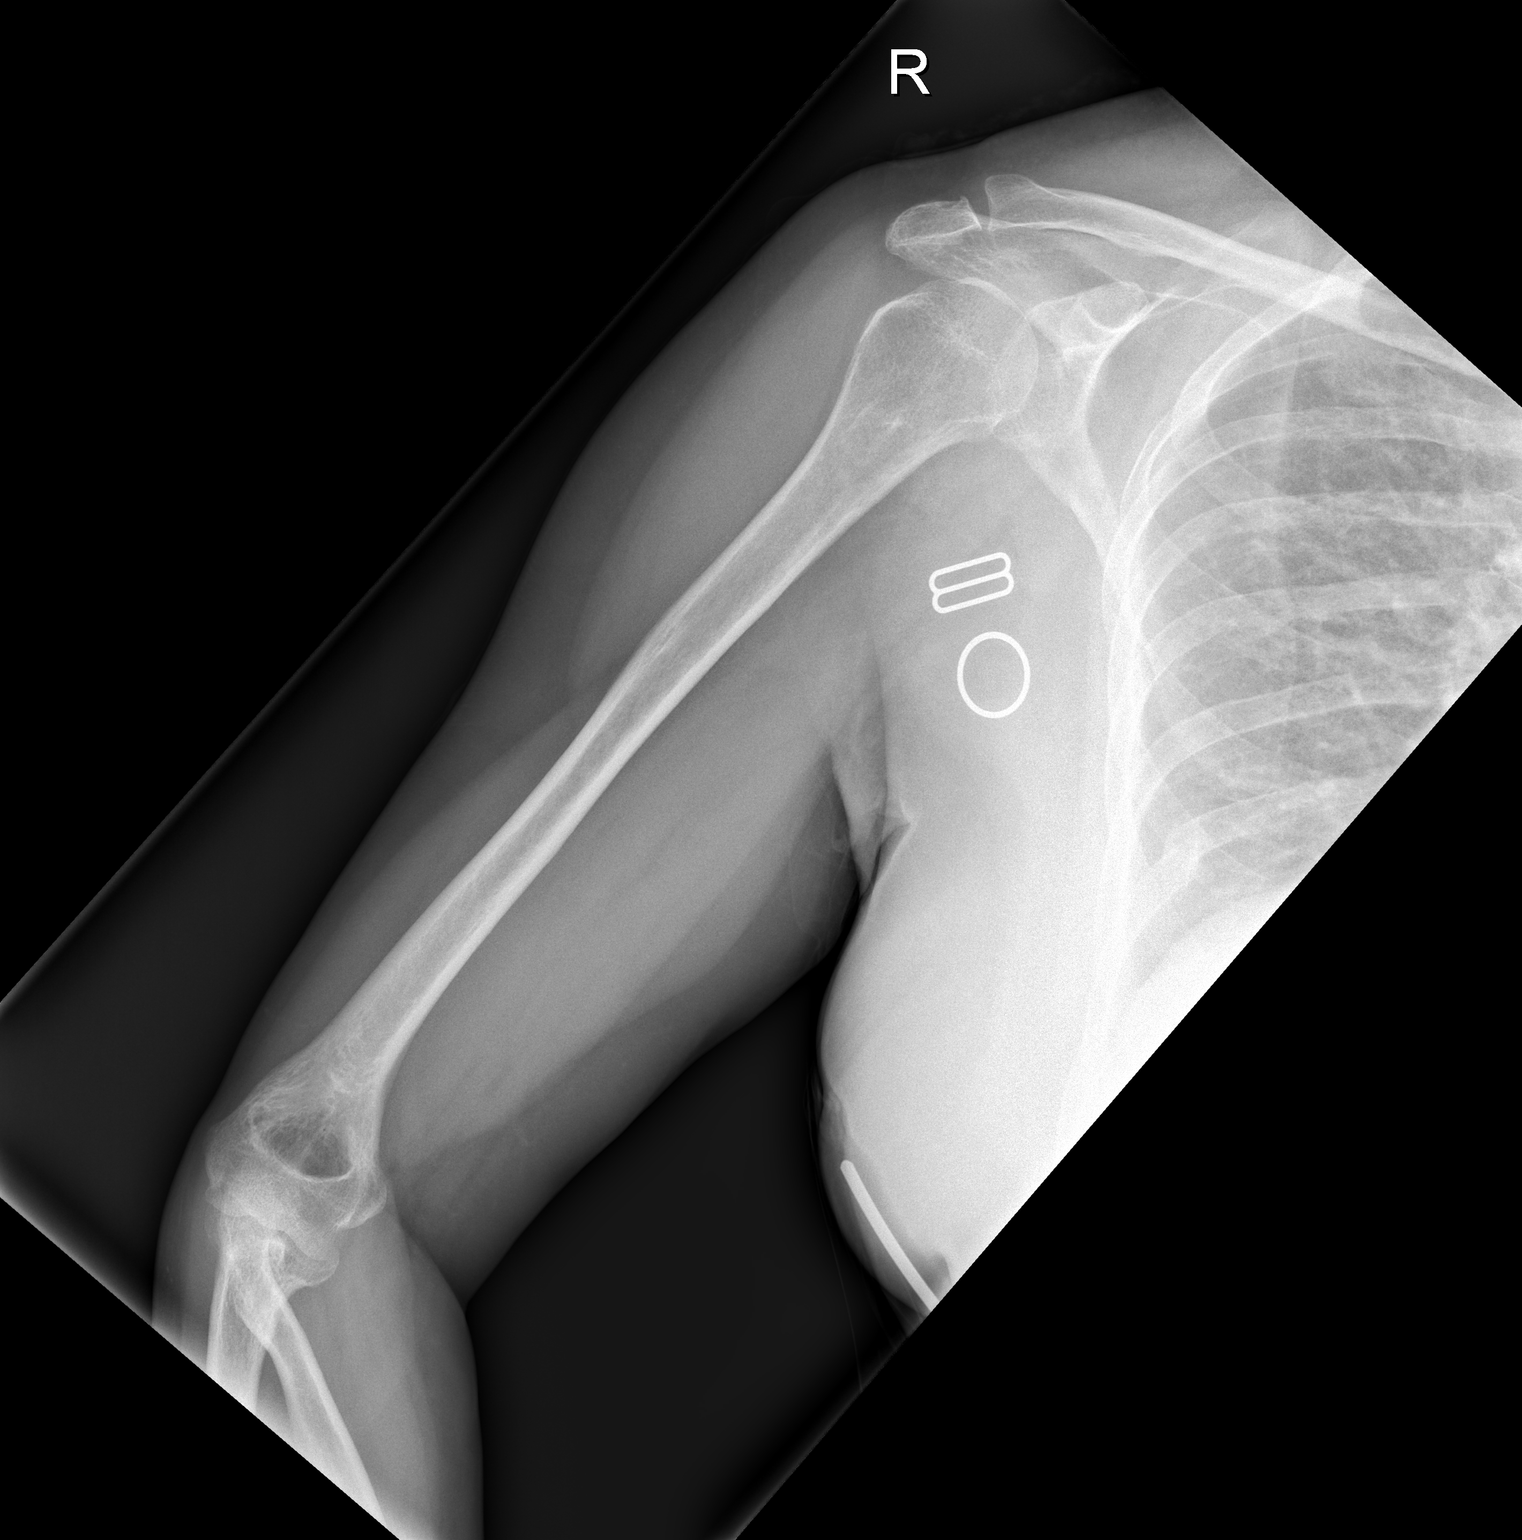

[2 of 2 positions shown; findings below may reference images not displayed]

FINDINGS: There is no evidence of fracture or other focal bone lesions. Soft
tissues are unremarkable.
IMPRESSION: Negative.

## 2021-07-01 IMAGING — CR DG CERVICAL SPINE 2 OR 3 VIEWS
3 series · 3 of 3 positions shown · non-contrast
Comparison: None.

CLINICAL DATA: Right shoulder and arm pain.

EXAM:
CERVICAL SPINE - 2-3 VIEW

[w cervical spine lat]
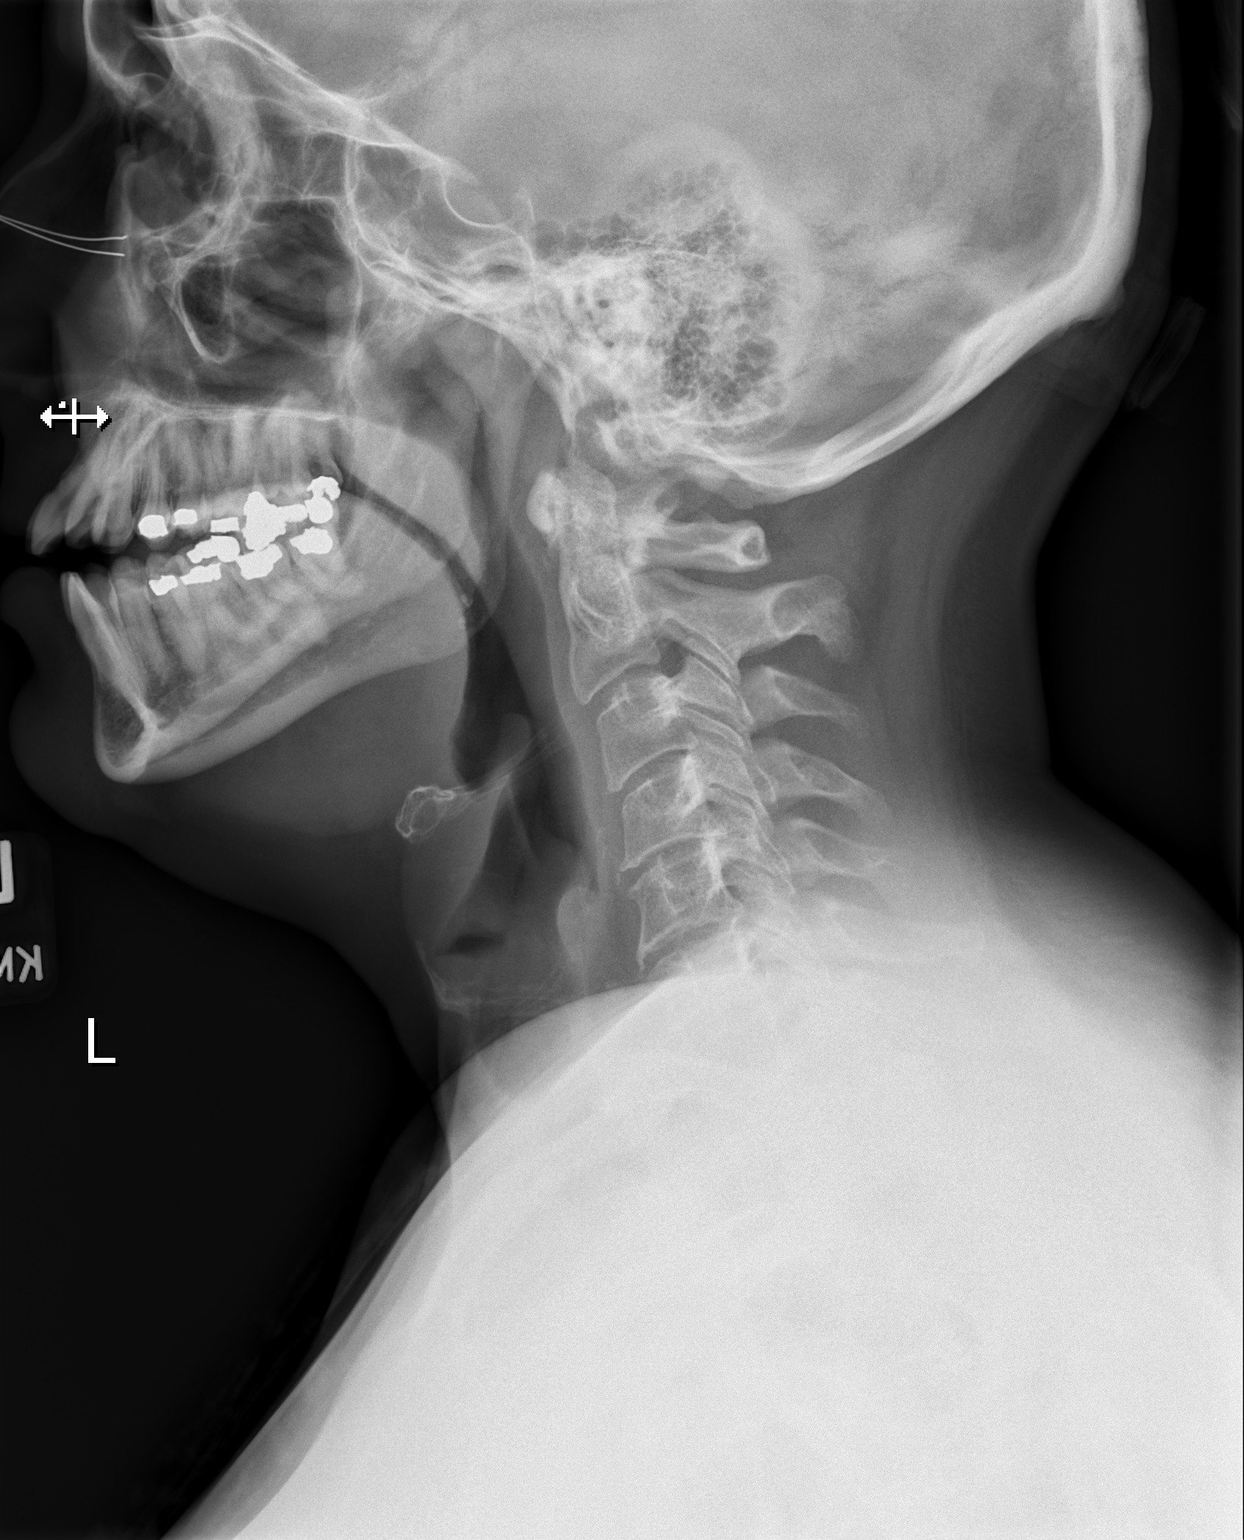

[w cervical swimmers]
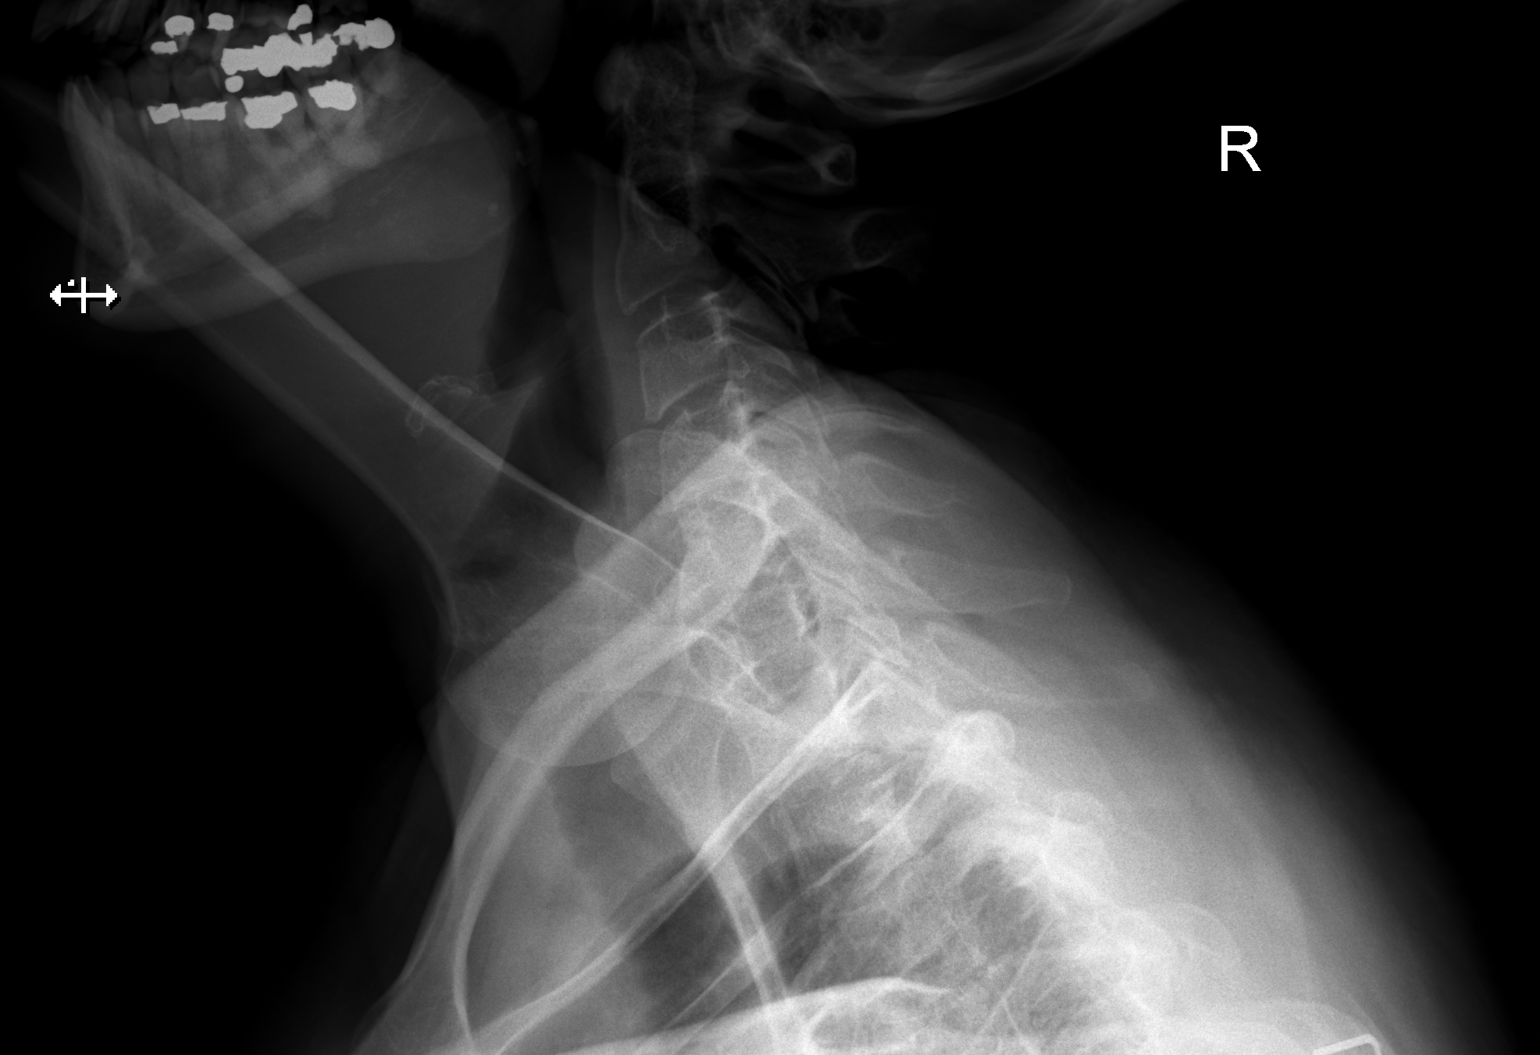

[w cervical spine ap]
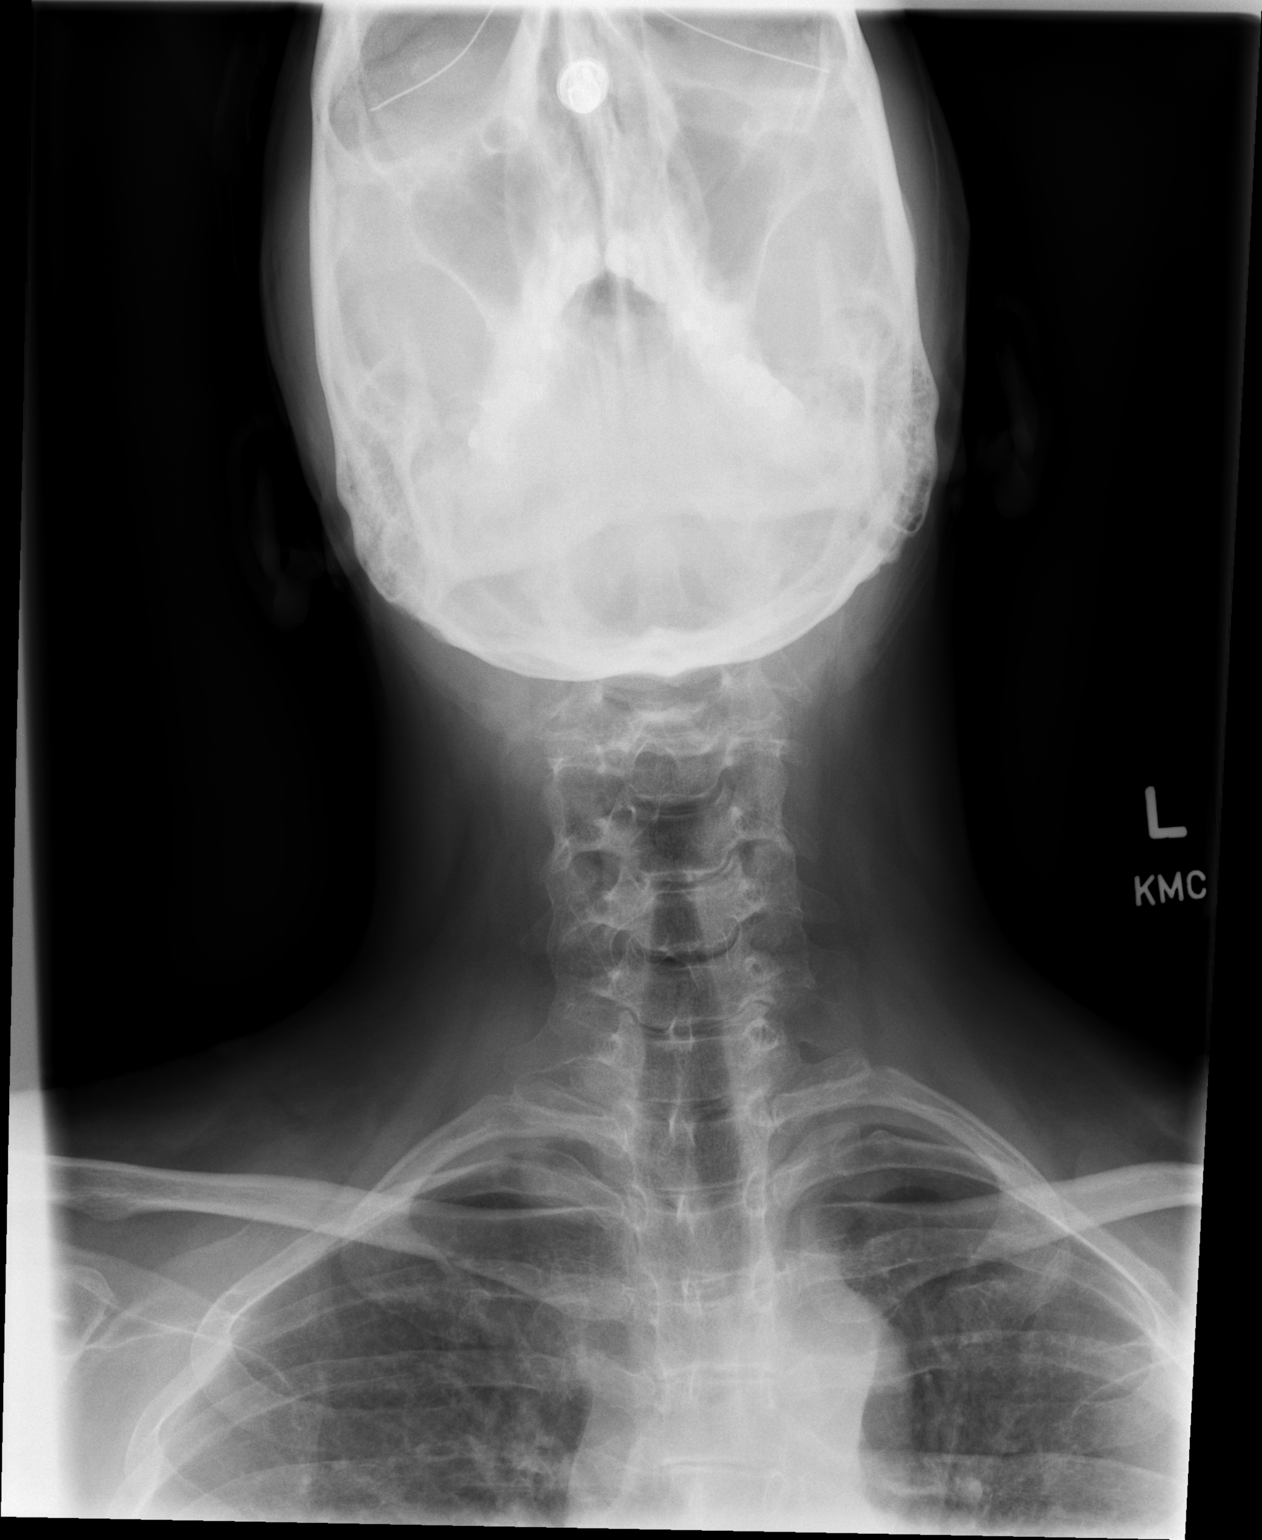

[3 of 3 positions shown; findings below may reference images not displayed]

FINDINGS: The lateral view is diagnostic to the C5-C6 level.

There is no acute fracture or subluxation. Vertebral body heights
are preserved.

Straightening of the normal cervical lordosis. No significant
listhesis.

Mild to moderate disc height loss from C3-C4 through C6-C7. Moderate
left and mild right uncovertebral hypertrophy at C6-C7.

Normal prevertebral soft tissues.
IMPRESSION: 1. Mild to moderate multilevel cervical spondylosis as described
above.

## 2021-07-11 ENCOUNTER — Encounter: Payer: Self-pay | Admitting: *Deleted

## 2021-09-04 ENCOUNTER — Telehealth: Payer: Self-pay | Admitting: Family Medicine

## 2021-09-17 ENCOUNTER — Other Ambulatory Visit (HOSPITAL_COMMUNITY)
Admission: RE | Admit: 2021-09-17 | Discharge: 2021-09-17 | Disposition: A | Discharge status: Home / Self Care | Payer: Medicare Other | Source: Ambulatory Visit | Attending: Obstetrics & Gynecology | Admitting: Obstetrics & Gynecology

## 2021-09-17 ENCOUNTER — Encounter (HOSPITAL_BASED_OUTPATIENT_CLINIC_OR_DEPARTMENT_OTHER): Payer: Self-pay | Admitting: Obstetrics & Gynecology

## 2021-09-17 ENCOUNTER — Ambulatory Visit (INDEPENDENT_AMBULATORY_CARE_PROVIDER_SITE_OTHER): Payer: Medicare Other | Admitting: Obstetrics & Gynecology

## 2021-09-17 VITALS — BP 124/76 | HR 84 | Ht 64.0 in | Wt 209.8 lb

## 2021-09-17 DIAGNOSIS — Z01419 Encounter for gynecological examination (general) (routine) without abnormal findings: Secondary | ICD-10-CM

## 2021-09-17 DIAGNOSIS — Z1151 Encounter for screening for human papillomavirus (HPV): Secondary | ICD-10-CM | POA: Diagnosis not present

## 2021-09-17 DIAGNOSIS — Z124 Encounter for screening for malignant neoplasm of cervix: Secondary | ICD-10-CM

## 2021-09-17 DIAGNOSIS — Z1231 Encounter for screening mammogram for malignant neoplasm of breast: Secondary | ICD-10-CM

## 2021-09-17 DIAGNOSIS — Z3141 Encounter for fertility testing: Secondary | ICD-10-CM

## 2021-09-17 DIAGNOSIS — Z1211 Encounter for screening for malignant neoplasm of colon: Secondary | ICD-10-CM

## 2021-09-17 NOTE — Progress Notes (Signed)
46 y.o. G2P2 Remarried White or Caucasian female here for annual exam.  Reports her cycles are normal.  She has a cycle every month with flow lasting 4-5 days.  Has questions about possible future pregnancy.  Discussed with pt this is less likely given age and would likely need to consider donor egg.  She is not interested in this.  Would like fertility testing.  Tactile interpreter Macy Mis present for entire conversation and exam.  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     Patient's last menstrual period was 09/07/2021.          Sexually active: Yes.    The current method of family planning is none.    Smoker:  no  Health Maintenance: Pap:  has been several years History of abnormal Pap:  no MMG:  has never had one Colonoscopy:  has done a cologuard 05/2021 (in [place]) BMD:   not indicated Screening Labs: has PCP, Dr. Carlota Raspberry   reports that she has quit smoking. Her smoking use included cigarettes. She has quit using smokeless tobacco. She reports current alcohol use of about 3.0 standard drinks of alcohol per week. She reports current drug use. Drug: Marijuana.  Past Medical History:  Diagnosis Date   Cataract    Congenital glaucoma of both eyes    Heart murmur     Past Surgical History:  Procedure Laterality Date   EYE SURGERY     EYE SURGERY     EYE SURGERY     EYE SURGERY     EYE SURGERY     EYE SURGERY     EYE SURGERY     EYE SURGERY     HERNIA REPAIR      Current Outpatient Medications  Medication Sig Dispense Refill   Multiple Vitamin (MULTIVITAMIN) tablet Take 1 tablet by mouth daily.     bimatoprost (LUMIGAN) 0.03 % ophthalmic solution 1 drop at bedtime. (Patient not taking: Reported on 09/17/2021)     dorzolamide-timolol (COSOPT) 22.3-6.8 MG/ML ophthalmic solution 1 drop 2 (two) times daily. (Patient not taking: Reported on 09/17/2021)     pilocarpine (PILOCAR) 1 % ophthalmic solution 1 drop 4 (four) times daily.  (Patient not taking: Reported on 09/17/2021)     No current facility-administered medications for this visit.    Family History  Problem Relation Age of Onset   Hyperlipidemia Father    Heart disease Father    Diabetes Sister    Diabetes Maternal Grandmother    GYN: Genitourinary:negative  Exam:   BP 124/76 (BP Location: Right Arm, Patient Position: Sitting, Cuff Size: Large)   Pulse 84   Ht 5' 4"  (1.626 m) Comment: reported  Wt 209 lb 12.8 oz (95.2 kg)   LMP 09/07/2021   BMI 36.01 kg/m   Height: 5' 4"  (162.6 cm) (reported)  General appearance: alert, cooperative and appears stated age Head: Normocephalic, without obvious abnormality, atraumatic Neck: no adenopathy, supple, symmetrical, trachea midline and thyroid normal to inspection and palpation Lungs: clear to auscultation bilaterally Breasts: normal appearance, no masses or tenderness Heart: Grade 2 systolic murmur present Abdomen: soft, non-tender; bowel sounds normal; no masses,  no organomegaly Extremities: extremities normal, atraumatic, no cyanosis or edema Skin: Skin color, texture, turgor normal. No rashes or lesions Lymph nodes: Cervical, supraclavicular, and axillary nodes normal. No abnormal inguinal nodes palpated Neurologic: Grossly normal   Pelvic: External genitalia:  no lesions  Urethra:  normal appearing urethra with no masses, tenderness or lesions              Bartholins and Skenes: normal                 Vagina: normal appearing vagina with normal color and no discharge, no lesions              Cervix: no lesions              Pap taken: Yes.   Bimanual Exam:  Uterus:  normal size, contour, position, consistency, mobility, non-tender              Adnexa: normal adnexa and no mass, fullness, tenderness               Rectovaginal: Confirms               Anus:  normal sphincter tone, no lesions  Chaperone, Octaviano Batty, CMA, was present for exam.  Assessment/Plan: 1. Well woman exam  with routine gynecological exam - pap and HR HPV obtained today - MMG order placed and guidelines reviewed - had done cologuard - lab work is done with Dr. Carlota Raspberry - Tdap done 2020  2. Cervical cancer screening - Cytology - PAP( [facility])  3. Fertility testing - Follicle stimulating hormone  4. Encounter for screening mammogram for malignant neoplasm of breast - MM 3D SCREEN BREAST BILATERAL; Future

## 2021-09-18 LAB — CYTOLOGY - PAP
Comment: NEGATIVE
Diagnosis: NEGATIVE
High risk HPV: NEGATIVE

## 2021-09-18 LAB — FOLLICLE STIMULATING HORMONE: FSH: 15.8 m[IU]/mL

## 2021-11-19 ENCOUNTER — Telehealth: Payer: Self-pay

## 2021-11-19 NOTE — Telephone Encounter (Signed)
Left vm using sign language interpreter services letting patient know that the questions she had regarding one of the bills she brought into the office has been taken care of and recoded/rebilled.

## 2021-11-28 ENCOUNTER — Encounter (HOSPITAL_BASED_OUTPATIENT_CLINIC_OR_DEPARTMENT_OTHER): Payer: Self-pay | Admitting: Obstetrics & Gynecology

## 2021-11-30 NOTE — Telephone Encounter (Signed)
error 

## 2021-12-28 ENCOUNTER — Telehealth: Payer: Self-pay | Admitting: Family Medicine

## 2021-12-28 NOTE — Telephone Encounter (Signed)
Caller name: Janett Billow the dispatcher for Interpreter   On DPR? :yes/no: No  Call back number: [phone]  Provider they see:  Carlota Raspberry  Reason for call: Janett Billow called stating that unfortunately they don't have a interpreter available for pt at the time of her appt on 12/31/21. Janett Billow states that pt needs a specific type of interpreter. The type interpreter pt needs is only available at 3:00 pm on 12/31/21. Please advise

## 2021-12-31 ENCOUNTER — Ambulatory Visit (INDEPENDENT_AMBULATORY_CARE_PROVIDER_SITE_OTHER): Payer: Medicare Other | Admitting: Family Medicine

## 2021-12-31 ENCOUNTER — Encounter: Payer: Self-pay | Admitting: Family Medicine

## 2021-12-31 VITALS — BP 133/88 | HR 56 | Temp 97.7°F | Ht 64.0 in | Wt 211.6 lb

## 2021-12-31 DIAGNOSIS — N390 Urinary tract infection, site not specified: Secondary | ICD-10-CM

## 2021-12-31 DIAGNOSIS — R32 Unspecified urinary incontinence: Secondary | ICD-10-CM | POA: Diagnosis not present

## 2021-12-31 DIAGNOSIS — R319 Hematuria, unspecified: Secondary | ICD-10-CM

## 2021-12-31 LAB — POCT URINALYSIS DIPSTICK
Bilirubin, UA: NEGATIVE
Glucose, UA: NEGATIVE
Nitrite, UA: NEGATIVE
Protein, UA: POSITIVE — AB
Spec Grav, UA: >=1.03 — AB (ref 1.010–1.025)
Urobilinogen, UA: NEGATIVE E.U./dL — AB
pH, UA: 5 (ref 5.0–8.0)

## 2021-12-31 LAB — URINALYSIS, MICROSCOPIC ONLY

## 2021-12-31 MED ORDER — NITROFURANTOIN MONOHYD MACRO 100 MG PO CAPS: 100.0000 mg | ORAL_CAPSULE | Freq: Two times a day (BID) | ORAL | 0 refills | Status: DC

## 2021-12-31 NOTE — Patient Instructions (Addendum)
Start antibiotic for possible infection. Blood may be due to infection, but will recheck in 1 week. If new pain, or any worsening be seen sooner as kidney stones can cause blood as well. If trouble controlling urine does not improve we can also look at other treatments or urology appointment.   Return to the clinic or go to the nearest emergency room if any of your symptoms worsen or new symptoms occur.   Urinary Incontinence Urinary incontinence refers to a condition in which a person is unable to control where and when to pass urine. A person with this condition will urinate involuntarily. This means that the person urinates when he or she does not mean to. What are the causes? This condition may be caused by: Medicines. Infections. Constipation. Overactive bladder muscles. Weak bladder muscles. Weak pelvic floor muscles. These muscles provide support for the bladder, intestine, and, in women, the uterus. Enlarged prostate in men. The prostate is a gland near the bladder. When it gets too big, it can pinch the urethra. With the urethra blocked, the bladder can weaken and lose the ability to empty properly. Surgery. Emotional factors, such as anxiety, stress, or post-traumatic stress disorder (PTSD). Spinal cord injury, nerve injury, or other neurological conditions. Pelvic organ prolapse. This happens in women when organs move out of place and into the vagina. This movement can prevent the bladder and urethra from working properly. What increases the risk? The following factors may make you more likely to develop this condition: Age. The older you are, the higher the risk. Obesity. Being physically inactive. Pregnancy and childbirth. Menopause. Diseases that affect the nerves or spinal cord. Long-term, or chronic, coughing. This can increase pressure on the bladder and pelvic floor muscles. What are the signs or symptoms? Symptoms may vary depending on the type of urinary incontinence  you have. They include: A sudden urge to urinate, and passing urine involuntarily before you can get to a bathroom (urge incontinence). Suddenly passing urine when doing activities that force urine to pass, such as coughing, laughing, exercising, or sneezing (stress incontinence). Needing to urinate often but urinating only a small amount, or constantly dribbling urine (overflow incontinence). Urinating because you cannot get to the bathroom in time due to a physical disability, such as arthritis or injury, or due to a communication or thinking problem, such as Alzheimer's disease (functional incontinence). How is this diagnosed? This condition may be diagnosed based on: Your medical history. A physical exam. Tests, such as: Urine tests. X-rays of your kidney and bladder. Ultrasound. CT scan. Cystoscopy. In this procedure, a health care provider inserts a tube with a light and camera (cystoscope) through the urethra and into the bladder to check for problems. Urodynamic testing. These tests assess how well the bladder, urethra, and sphincter can store and release urine. There are different types of urodynamic tests, and they vary depending on what the test is measuring. To help diagnose your condition, your health care provider may recommend that you keep a log of when you urinate and how much you urinate. How is this treated? Treatment for this condition depends on the type of incontinence that you have and its cause. Treatment may include: Lifestyle changes, such as: Quitting smoking. Maintaining a healthy weight. Staying active. Try to get 150 minutes of moderate-intensity exercise every week. Ask your health care provider which activities are safe for you. Eating a healthy diet. Avoid high-fat foods, like fried foods. Avoid refined carbohydrates like white bread and white rice.  Limit how much alcohol and caffeine you drink. Increase your fiber intake. Healthy sources of fiber include  beans, whole grains, and fresh fruits and vegetables. Behavioral changes, such as: Pelvic floor muscle exercises. Bladder training, such as lengthening the amount of time between bathroom breaks, or using the bathroom at regular intervals. Using techniques to suppress bladder urges. This can include distraction techniques or controlled breathing exercises. Medicines, such as: Medicines to relax the bladder muscles and prevent bladder spasms. Medicines to help slow or prevent the growth of a man's prostate. Botox injections. These can help relax the bladder muscles. Treatments, such as: Using pulses of electricity to help change bladder reflexes (electrical nerve stimulation). For women, using a medical device to prevent urine leaks. This is a small, tampon-like, disposable device that is inserted into the urethra. Injecting collagen or carbon beads (bulking agents) into the urinary sphincter. These can help thicken tissue and close the bladder opening. Surgery. Follow these instructions at home: Lifestyle Limit alcohol and caffeine. These can fill your bladder quickly and irritate it. Keep yourself clean to help prevent odors and skin damage. Ask your health care provider about special skin creams and cleansers that can protect the skin from urine. Consider wearing pads or adult diapers. Make sure to change them regularly, and always change them right after experiencing incontinence. General instructions Take over-the-counter and prescription medicines only as told by your health care provider. Use the bathroom about every 3-4 hours, even if you do not feel the need to urinate. Try to empty your bladder completely every time. After urinating, wait a minute. Then try to urinate again. Make sure you are in a relaxed position while urinating. If your incontinence is caused by nerve problems, keep a log of the medicines you take and the times you go to the bathroom. Keep all follow-up visits.  This is important. Where to find more information [employer] and Digestive [employer] Kidney Diseases: CarFli[employer]tn American Urology Association: www.urologyhealth.org Contact a health care provider if: You have pain that gets worse. Your incontinence gets worse. Get help right away if: You have a fever or chills. You are unable to urinate. You have redness in your groin area or down your legs. Summary Urinary incontinence refers to a condition in which a person is unable to control where and when to pass urine. This condition may be caused by medicines, infection, weak bladder muscles, weak pelvic floor muscles, enlargement of the prostate (in men), or surgery. Factors such as older age, obesity, pregnancy and childbirth, menopause, neurological diseases, and chronic coughing may increase your risk for developing this condition. Types of urinary incontinence include urge incontinence, stress incontinence, overflow incontinence, and functional incontinence. This condition is usually treated first with lifestyle and behavioral changes, such as quitting smoking, eating a healthier diet, and doing regular pelvic floor exercises. Other treatment options include medicines, bulking agents, medical devices, electrical nerve stimulation, or surgery. This information is not intended to replace advice given to you by your health care provider. Make sure you discuss any questions you have with your health care provider. Document Revised: 10/15/2019 Document Reviewed: 10/15/2019 Elsevier Patient Education  2023 Elsevier Inc.   Urinary Tract Infection, Adult  A urinary tract infection (UTI) is an infection of any part of the urinary tract. The urinary tract includes the kidneys, ureters, bladder, and urethra. These organs make, store, and get rid of urine in the body. An upper UTI affects the ureters and kidneys. A lower UTI  affects the bladder and urethra. What are the causes? Most  urinary tract infections are caused by bacteria in your genital area around your urethra, where urine leaves your body. These bacteria grow and cause inflammation of your urinary tract. What increases the risk? You are more likely to develop this condition if: You have a urinary catheter that stays in place. You are not able to control when you urinate or have a bowel movement (incontinence). You are female and you: Use a spermicide or diaphragm for birth control. Have low estrogen levels. Are pregnant. You have certain genes that increase your risk. You are sexually active. You take antibiotic medicines. You have a condition that causes your flow of urine to slow down, such as: An enlarged prostate, if you are female. Blockage in your urethra. A kidney stone. A nerve condition that affects your bladder control (neurogenic bladder). Not getting enough to drink, or not urinating often. You have certain medical conditions, such as: Diabetes. A weak disease-fighting system (immunesystem). Sickle cell disease. Gout. Spinal cord injury. What are the signs or symptoms? Symptoms of this condition include: Needing to urinate right away (urgency). Frequent urination. This may include small amounts of urine each time you urinate. Pain or burning with urination. Blood in the urine. Urine that smells bad or unusual. Trouble urinating. Cloudy urine. Vaginal discharge, if you are female. Pain in the abdomen or the lower back. You may also have: Vomiting or a decreased appetite. Confusion. Irritability or tiredness. A fever or chills. Diarrhea. The first symptom in older adults may be confusion. In some cases, they may not have any symptoms until the infection has worsened. How is this diagnosed? This condition is diagnosed based on your medical history and a physical exam. You may also have other tests, including: Urine tests. Blood tests. Tests for STIs (sexually transmitted  infections). If you have had more than one UTI, a cystoscopy or imaging studies may be done to determine the cause of the infections. How is this treated? Treatment for this condition includes: Antibiotic medicine. Over-the-counter medicines to treat discomfort. Drinking enough water to stay hydrated. If you have frequent infections or have other conditions such as a kidney stone, you may need to see a health care provider who specializes in the urinary tract (urologist). In rare cases, urinary tract infections can cause sepsis. Sepsis is a life-threatening condition that occurs when the body responds to an infection. Sepsis is treated in the hospital with IV antibiotics, fluids, and other medicines. Follow these instructions at home:  Medicines Take over-the-counter and prescription medicines only as told by your health care provider. If you were prescribed an antibiotic medicine, take it as told by your health care provider. Do not stop using the antibiotic even if you start to feel better. General instructions Make sure you: Empty your bladder often and completely. Do not hold urine for long periods of time. Empty your bladder after sex. Wipe from front to back after urinating or having a bowel movement if you are female. Use each tissue only one time when you wipe. Drink enough fluid to keep your urine pale yellow. Keep all follow-up visits. This is important. Contact a health care provider if: Your symptoms do not get better after 1-2 days. Your symptoms go away and then return. Get help right away if: You have severe pain in your back or your lower abdomen. You have a fever or chills. You have nausea or vomiting. Summary A urinary tract infection (UTI)  is an infection of any part of the urinary tract, which includes the kidneys, ureters, bladder, and urethra. Most urinary tract infections are caused by bacteria in your genital area. Treatment for this condition often includes  antibiotic medicines. If you were prescribed an antibiotic medicine, take it as told by your health care provider. Do not stop using the antibiotic even if you start to feel better. Keep all follow-up visits. This is important. This information is not intended to replace advice given to you by your health care provider. Make sure you discuss any questions you have with your health care provider. Document Revised: 10/22/2019 Document Reviewed: 10/22/2019 Elsevier Patient Education  2023 Elsevier Inc.   Hematuria, Adult Hematuria is blood in the urine. Blood may be visible in the urine, or it may be identified with a test. This condition can be caused by infections of the bladder, urethra, kidney, or prostate. Other possible causes include: Kidney stones. Cancer of the urinary tract. Too much calcium in the urine. Conditions that are passed from parent to child (inherited conditions). Exercise that requires a lot of energy. Infections can usually be treated with medicine, and a kidney stone usually will pass through your urine. If neither of these is the cause of your hematuria, more tests may be needed to identify the cause of your symptoms. It is very important to tell your health care provider about any blood in your urine, even if it is painless or the blood stops without treatment. Blood in the urine, when it happens and then stops and then happens again, can be a symptom of a very serious condition, including cancer. There is no pain in the initial stages of many urinary cancers. Follow these instructions at home: Medicines Take over-the-counter and prescription medicines only as told by your health care provider. If you were prescribed an antibiotic medicine, take it as told by your health care provider. Do not stop taking the antibiotic even if you start to feel better. Eating and drinking Drink enough fluid to keep your urine pale yellow. It is recommended that you drink 3-4 quarts  (2.8-3.8 L) a day. If you have been diagnosed with an infection, drinking cranberry juice in addition to large amounts of water is recommended. Avoid caffeine, tea, and carbonated beverages. These tend to irritate the bladder. Avoid alcohol because it may irritate the prostate (in males). General instructions If you have been diagnosed with a kidney stone, follow your health care provider's instructions about straining your urine to catch the stone. Empty your bladder often. Avoid holding urine for long periods of time. If you are female: After a bowel movement, wipe from front to back and use each piece of toilet paper only once. Empty your bladder before and after sex. Pay attention to any changes in your symptoms. Tell your health care provider about any changes or any new symptoms. It is up to you to get the results of any tests. Ask your health care provider, or the department that is doing the test, when your results will be ready. Keep all follow-up visits. This is important. Contact a health care provider if: You develop back pain. You have a fever or chills. You have nausea or vomiting. Your symptoms do not improve after 3 days. Your symptoms get worse. Get help right away if: You develop severe vomiting and are unable to take medicine without vomiting. You develop severe pain in your back or abdomen even though you are taking medicine. You pass a  large amount of blood in your urine. You pass blood clots in your urine. You feel very weak or like you might faint. You faint. Summary Hematuria is blood in the urine. It has many possible causes. It is very important that you tell your health care provider about any blood in your urine, even if it is painless or the blood stops without treatment. Take over-the-counter and prescription medicines only as told by your health care provider. Drink enough fluid to keep your urine pale yellow. This information is not intended to replace  advice given to you by your health care provider. Make sure you discuss any questions you have with your health care provider. Document Revised: 11/10/2019 Document Reviewed: 11/10/2019 Elsevier Patient Education  [employer].

## 2021-12-31 NOTE — Progress Notes (Addendum)
Subjective:  Patient ID: Melissa Grant, female    DOB: 10/21/1975  Age: 46 y.o. MRN: [id number]  CC:  Chief Complaint  Patient presents with   bladder  incontinence    HPI [name] presents for   Here with spouse - tactile sign language interpreting.  Urinary incontinence:  Leaking at night and early morning at times. On and off since 11/03/21 when dad moved in with them.   No fever, back pain. Some R sided flank soreness at times  -  not currently. Spasms when needing to urinate. Frequency at night.  No new vaginal irritation or d/c.  Followed by gynecology for fertility testing. LNMP 9/27.  Attempted tx: none.   History Patient Active Problem List   Diagnosis Date Noted   GLAUCOMA 05/22/2006   CATARACT 05/22/2006   HEARING LOSS NOS OR DEAFNESS 05/22/2006   MITRAL VALVE PROLAPSE 05/22/2006   AORTIC VALVE INSUFFICIENCY 05/22/2006   HEART BLOCK 05/22/2006   Past Medical History:  Diagnosis Date   Cataract    Congenital glaucoma of both eyes    Heart murmur    Past Surgical History:  Procedure Laterality Date   EYE SURGERY     EYE SURGERY     EYE SURGERY     EYE SURGERY     EYE SURGERY     EYE SURGERY     EYE SURGERY     EYE SURGERY     HERNIA REPAIR     Allergies  Allergen Reactions   Alphagan [Brimonidine] Itching, Swelling and Other (See Comments)    Watery eyes   Prior to Admission medications   Medication Sig Start Date End Date Taking? Authorizing Provider  Multiple Vitamin (MULTIVITAMIN) tablet Take 1 tablet by mouth daily.   Yes [provider]  bimatoprost (LUMIGAN) 0.03 % ophthalmic solution 1 drop at bedtime. Patient not taking: Reported on 09/17/2021    [provider]  dorzolamide-timolol (COSOPT) 22.3-6.8 MG/ML ophthalmic solution 1 drop 2 (two) times daily. Patient not taking: Reported on 09/17/2021    [provider]  pilocarpine (PILOCAR) 1 % ophthalmic solution 1 drop 4 (four) times daily. Patient not taking:  Reported on 09/17/2021    [provider]   Social History   Socioeconomic History   Marital status: Married    Spouse name: Not on file   Number of children: Not on file   Years of education: Not on file   Highest education level: Not on file  Occupational History   Not on file  Tobacco Use   Smoking status: Former    Types: Cigarettes   Smokeless tobacco: Former   Tobacco comments:    Briefly as a teenager  Advertising account plannerVaping Use   Vaping Use: Never used  Substance and Sexual Activity   Alcohol use: Yes    Alcohol/week: 3.0 standard drinks of alcohol    Types: 3 Glasses of wine per week    Comment: pt drinks rarely, but when she dose normaly has 3 sevings   Drug use: Yes    Types: Marijuana   Sexual activity: Not Currently  Other Topics Concern   Not on file  Social History Narrative   Not on file   Social Determinants of Health   Financial Resource Strain: Not on file  Food Insecurity: Not on file  Transportation Needs: Not on file  Physical Activity: Not on file  Stress: Not on file  Social Connections: Not on file  Intimate Partner Violence: Not on  file    Review of Systems Per HPI.   Objective:   Vitals:   12/31/21 1123  BP: 133/88  Pulse: (!) 56  Temp: 97.7 F (36.5 C)  SpO2: 97%  Weight: 211 lb 9.6 oz (96 kg)  Height: 5\' 4"  (1.626 m)     Physical Exam Constitutional:      Appearance: Normal appearance. She is well-developed.  HENT:     Head: Normocephalic and atraumatic.  Pulmonary:     Effort: Pulmonary effort is normal.     Comments: Min discomfort lower R chest wall with palpation. No abd/cva ttp.  Abdominal:     General: There is no distension.     Palpations: Abdomen is soft.     Tenderness: There is no abdominal tenderness. There is no guarding or rebound.  Skin:    General: Skin is warm.  Neurological:     Mental Status: She is alert and oriented to person, place, and time.  Psychiatric:        Behavior: Behavior normal.       Results for orders placed or performed in visit on 12/31/21  POCT Urinalysis Dipstick  Result Value Ref Range   Color, UA Yellow    Clarity, UA Clear    Glucose, UA Negative Negative   Bilirubin, UA negative    Ketones, UA small    Spec Grav, UA >=1.030 (A) 1.010 - 1.025   Blood, UA 250+++    pH, UA 5.0 5.0 - 8.0   Protein, UA Positive (A) Negative   Urobilinogen, UA negative (A) 0.2 or 1.0 E.U./dL   Nitrite, UA Negative    Leukocytes, UA Trace (A) Negative   Appearance     Odor       Assessment & Plan:  Melissa Grant is a 46 y.o. female . Urinary tract infection with hematuria, site unspecified - Plan: Urine Microscopic, nitrofurantoin, macrocrystal-monohydrate, (MACROBID) 100 MG capsule  Urinary incontinence, unspecified type - Plan: POCT Urinalysis Dipstick, Urine Culture, Urine Culture  Hematuria, unspecified type  New urinary incontinence, with hematuria, some frequency. Possiboe UTI. Less liely nephrolith. Check micro, culture, start macrobid, recheck in 1 week with ER precautions in the interim. Consider urology eval if persistent incontinence or hematuria.   History obtained in discussion of plan with the patient with use of written instructions with spouse who then translated with sign language.  Understanding of plan expressed.  Meds ordered this encounter  Medications   nitrofurantoin, macrocrystal-monohydrate, (MACROBID) 100 MG capsule    Sig: Take 1 capsule (100 mg total) by mouth 2 (two) times daily.    Dispense:  14 capsule    Refill:  0   Patient Instructions  Start antibiotic for possible infection. Blood may be due to infection, but will recheck in 1 week. If new pain, or any worsening be seen sooner as kidney stones can cause blood as well. If trouble controlling urine does not improve we can also look at other treatments or urology appointment.   Return to the clinic or go to the nearest emergency room if any of your symptoms worsen or new  symptoms occur.   Urinary Incontinence Urinary incontinence refers to a condition in which a person is unable to control where and when to pass urine. A person with this condition will urinate involuntarily. This means that the person urinates when he or she does not mean to. What are the causes? This condition may be caused by: Medicines. Infections. Constipation. Overactive bladder muscles. Weak  bladder muscles. Weak pelvic floor muscles. These muscles provide support for the bladder, intestine, and, in women, the uterus. Enlarged prostate in men. The prostate is a gland near the bladder. When it gets too big, it can pinch the urethra. With the urethra blocked, the bladder can weaken and lose the ability to empty properly. Surgery. Emotional factors, such as anxiety, stress, or post-traumatic stress disorder (PTSD). Spinal cord injury, nerve injury, or other neurological conditions. Pelvic organ prolapse. This happens in women when organs move out of place and into the vagina. This movement can prevent the bladder and urethra from working properly. What increases the risk? The following factors may make you more likely to develop this condition: Age. The older you are, the higher the risk. Obesity. Being physically inactive. Pregnancy and childbirth. Menopause. Diseases that affect the nerves or spinal cord. Long-term, or chronic, coughing. This can increase pressure on the bladder and pelvic floor muscles. What are the signs or symptoms? Symptoms may vary depending on the type of urinary incontinence you have. They include: A sudden urge to urinate, and passing urine involuntarily before you can get to a bathroom (urge incontinence). Suddenly passing urine when doing activities that force urine to pass, such as coughing, laughing, exercising, or sneezing (stress incontinence). Needing to urinate often but urinating only a small amount, or constantly dribbling urine (overflow  incontinence). Urinating because you cannot get to the bathroom in time due to a physical disability, such as arthritis or injury, or due to a communication or thinking problem, such as Alzheimer's disease (functional incontinence). How is this diagnosed? This condition may be diagnosed based on: Your medical history. A physical exam. Tests, such as: Urine tests. X-rays of your kidney and bladder. Ultrasound. CT scan. Cystoscopy. In this procedure, a health care provider inserts a tube with a light and camera (cystoscope) through the urethra and into the bladder to check for problems. Urodynamic testing. These tests assess how well the bladder, urethra, and sphincter can store and release urine. There are different types of urodynamic tests, and they vary depending on what the test is measuring. To help diagnose your condition, your health care provider may recommend that you keep a log of when you urinate and how much you urinate. How is this treated? Treatment for this condition depends on the type of incontinence that you have and its cause. Treatment may include: Lifestyle changes, such as: Quitting smoking. Maintaining a healthy weight. Staying active. Try to get 150 minutes of moderate-intensity exercise every week. Ask your health care provider which activities are safe for you. Eating a healthy diet. Avoid high-fat foods, like fried foods. Avoid refined carbohydrates like white bread and white rice. Limit how much alcohol and caffeine you drink. Increase your fiber intake. Healthy sources of fiber include beans, whole grains, and fresh fruits and vegetables. Behavioral changes, such as: Pelvic floor muscle exercises. Bladder training, such as lengthening the amount of time between bathroom breaks, or using the bathroom at regular intervals. Using techniques to suppress bladder urges. This can include distraction techniques or controlled breathing exercises. Medicines, such  as: Medicines to relax the bladder muscles and prevent bladder spasms. Medicines to help slow or prevent the growth of a man's prostate. Botox injections. These can help relax the bladder muscles. Treatments, such as: Using pulses of electricity to help change bladder reflexes (electrical nerve stimulation). For women, using a medical device to prevent urine leaks. This is a small, tampon-like, disposable device that is inserted  into the urethra. Injecting collagen or carbon beads (bulking agents) into the urinary sphincter. These can help thicken tissue and close the bladder opening. Surgery. Follow these instructions at home: Lifestyle Limit alcohol and caffeine. These can fill your bladder quickly and irritate it. Keep yourself clean to help prevent odors and skin damage. Ask your health care provider about special skin creams and cleansers that can protect the skin from urine. Consider wearing pads or adult diapers. Make sure to change them regularly, and always change them right after experiencing incontinence. General instructions Take over-the-counter and prescription medicines only as told by your health care provider. Use the bathroom about every 3-4 hours, even if you do not feel the need to urinate. Try to empty your bladder completely every time. After urinating, wait a minute. Then try to urinate again. Make sure you are in a relaxed position while urinating. If your incontinence is caused by nerve problems, keep a log of the medicines you take and the times you go to the bathroom. Keep all follow-up visits. This is important. Where to find more information [employer] of Diabetes and Digestive and Kidney Diseases: [employer]tn American Urology Association: www.urologyhealth.org Contact a health care provider if: You have pain that gets worse. Your incontinence gets worse. Get help right away if: You have a fever or chills. You are unable to urinate. You have  redness in your groin area or down your legs. Summary Urinary incontinence refers to a condition in which a person is unable to control where and when to pass urine. This condition may be caused by medicines, infection, weak bladder muscles, weak pelvic floor muscles, enlargement of the prostate (in men), or surgery. Factors such as older age, obesity, pregnancy and childbirth, menopause, neurological diseases, and chronic coughing may increase your risk for developing this condition. Types of urinary incontinence include urge incontinence, stress incontinence, overflow incontinence, and functional incontinence. This condition is usually treated first with lifestyle and behavioral changes, such as quitting smoking, eating a healthier diet, and doing regular pelvic floor exercises. Other treatment options include medicines, bulking agents, medical devices, electrical nerve stimulation, or surgery. This information is not intended to replace advice given to you by your health care provider. Make sure you discuss any questions you have with your health care provider. Document Revised: 10/15/2019 Document Reviewed: 10/15/2019 Elsevier Patient Education  2023 Elsevier Inc.   Urinary Tract Infection, Adult  A urinary tract infection (UTI) is an infection of any part of the urinary tract. The urinary tract includes the kidneys, ureters, bladder, and urethra. These organs make, store, and get rid of urine in the body. An upper UTI affects the ureters and kidneys. A lower UTI affects the bladder and urethra. What are the causes? Most urinary tract infections are caused by bacteria in your genital area around your urethra, where urine leaves your body. These bacteria grow and cause inflammation of your urinary tract. What increases the risk? You are more likely to develop this condition if: You have a urinary catheter that stays in place. You are not able to control when you urinate or have a bowel  movement (incontinence). You are female and you: Use a spermicide or diaphragm for birth control. Have low estrogen levels. Are pregnant. You have certain genes that increase your risk. You are sexually active. You take antibiotic medicines. You have a condition that causes your flow of urine to slow down, such as: An enlarged prostate, if you are female. Blockage in your  urethra. A kidney stone. A nerve condition that affects your bladder control (neurogenic bladder). Not getting enough to drink, or not urinating often. You have certain medical conditions, such as: Diabetes. A weak disease-fighting system (immunesystem). Sickle cell disease. Gout. Spinal cord injury. What are the signs or symptoms? Symptoms of this condition include: Needing to urinate right away (urgency). Frequent urination. This may include small amounts of urine each time you urinate. Pain or burning with urination. Blood in the urine. Urine that smells bad or unusual. Trouble urinating. Cloudy urine. Vaginal discharge, if you are female. Pain in the abdomen or the lower back. You may also have: Vomiting or a decreased appetite. Confusion. Irritability or tiredness. A fever or chills. Diarrhea. The first symptom in older adults may be confusion. In some cases, they may not have any symptoms until the infection has worsened. How is this diagnosed? This condition is diagnosed based on your medical history and a physical exam. You may also have other tests, including: Urine tests. Blood tests. Tests for STIs (sexually transmitted infections). If you have had more than one UTI, a cystoscopy or imaging studies may be done to determine the cause of the infections. How is this treated? Treatment for this condition includes: Antibiotic medicine. Over-the-counter medicines to treat discomfort. Drinking enough water to stay hydrated. If you have frequent infections or have other conditions such as a kidney  stone, you may need to see a health care provider who specializes in the urinary tract (urologist). In rare cases, urinary tract infections can cause sepsis. Sepsis is a life-threatening condition that occurs when the body responds to an infection. Sepsis is treated in the hospital with IV antibiotics, fluids, and other medicines. Follow these instructions at home:  Medicines Take over-the-counter and prescription medicines only as told by your health care provider. If you were prescribed an antibiotic medicine, take it as told by your health care provider. Do not stop using the antibiotic even if you start to feel better. General instructions Make sure you: Empty your bladder often and completely. Do not hold urine for long periods of time. Empty your bladder after sex. Wipe from front to back after urinating or having a bowel movement if you are female. Use each tissue only one time when you wipe. Drink enough fluid to keep your urine pale yellow. Keep all follow-up visits. This is important. Contact a health care provider if: Your symptoms do not get better after 1-2 days. Your symptoms go away and then return. Get help right away if: You have severe pain in your back or your lower abdomen. You have a fever or chills. You have nausea or vomiting. Summary A urinary tract infection (UTI) is an infection of any part of the urinary tract, which includes the kidneys, ureters, bladder, and urethra. Most urinary tract infections are caused by bacteria in your genital area. Treatment for this condition often includes antibiotic medicines. If you were prescribed an antibiotic medicine, take it as told by your health care provider. Do not stop using the antibiotic even if you start to feel better. Keep all follow-up visits. This is important. This information is not intended to replace advice given to you by your health care provider. Make sure you discuss any questions you have with your health  care provider. Document Revised: 10/22/2019 Document Reviewed: 10/22/2019 Elsevier Patient Education  2023 Elsevier Inc.   Hematuria, Adult Hematuria is blood in the urine. Blood may be visible in the urine, or it may  be identified with a test. This condition can be caused by infections of the bladder, urethra, kidney, or prostate. Other possible causes include: Kidney stones. Cancer of the urinary tract. Too much calcium in the urine. Conditions that are passed from parent to child (inherited conditions). Exercise that requires a lot of energy. Infections can usually be treated with medicine, and a kidney stone usually will pass through your urine. If neither of these is the cause of your hematuria, more tests may be needed to identify the cause of your symptoms. It is very important to tell your health care provider about any blood in your urine, even if it is painless or the blood stops without treatment. Blood in the urine, when it happens and then stops and then happens again, can be a symptom of a very serious condition, including cancer. There is no pain in the initial stages of many urinary cancers. Follow these instructions at home: Medicines Take over-the-counter and prescription medicines only as told by your health care provider. If you were prescribed an antibiotic medicine, take it as told by your health care provider. Do not stop taking the antibiotic even if you start to feel better. Eating and drinking Drink enough fluid to keep your urine pale yellow. It is recommended that you drink 3-4 quarts (2.8-3.8 L) a day. If you have been diagnosed with an infection, drinking cranberry juice in addition to large amounts of water is recommended. Avoid caffeine, tea, and carbonated beverages. These tend to irritate the bladder. Avoid alcohol because it may irritate the prostate (in males). General instructions If you have been diagnosed with a kidney stone, follow your health care  provider's instructions about straining your urine to catch the stone. Empty your bladder often. Avoid holding urine for long periods of time. If you are female: After a bowel movement, wipe from front to back and use each piece of toilet paper only once. Empty your bladder before and after sex. Pay attention to any changes in your symptoms. Tell your health care provider about any changes or any new symptoms. It is up to you to get the results of any tests. Ask your health care provider, or the department that is doing the test, when your results will be ready. Keep all follow-up visits. This is important. Contact a health care provider if: You develop back pain. You have a fever or chills. You have nausea or vomiting. Your symptoms do not improve after 3 days. Your symptoms get worse. Get help right away if: You develop severe vomiting and are unable to take medicine without vomiting. You develop severe pain in your back or abdomen even though you are taking medicine. You pass a large amount of blood in your urine. You pass blood clots in your urine. You feel very weak or like you might faint. You faint. Summary Hematuria is blood in the urine. It has many possible causes. It is very important that you tell your health care provider about any blood in your urine, even if it is painless or the blood stops without treatment. Take over-the-counter and prescription medicines only as told by your health care provider. Drink enough fluid to keep your urine pale yellow. This information is not intended to replace advice given to you by your health care provider. Make sure you discuss any questions you have with your health care provider. Document Revised: 11/10/2019 Document Reviewed: 11/10/2019 Elsevier Patient Education  2023 [employer].     Signed,   Tinnie Gens  Carlota Raspberry, [place] 12/31/21 12:13 PM

## 2021-12-31 NOTE — Telephone Encounter (Signed)
I'm unsure of how this was no longer in the clinical pool as I see it was sent on 12/28/2021 at 10:00 am. The patient is now here and she is accompanied by her significant other who is also deaf but he is currently the only interpreter we have. Melissa Grant is making that known to Dr Carlota Raspberry. It looks as though we let them know we didn't have a later appointment and were waiting on a phone call from the interpreter but never received one. We should have been following up on this and call the interpreter again to see if we needed to reschedule the appointment etc. This has been discussed with Theo Dills.

## 2022-01-01 LAB — URINE CULTURE
MICRO NUMBER:: 14025700
Result:: NO GROWTH
SPECIMEN QUALITY:: ADEQUATE

## 2022-01-01 NOTE — Telephone Encounter (Signed)
Thank you for the time to look into this further and the follow up.

## 2022-01-01 NOTE — Telephone Encounter (Signed)
After calling the interpreter service line, I spoke with Janett Billow. She stated that she did call back to the direct line she was given and left a vm. I let Janett Billow know that we don't have a way to check vm's and I apologized she was given a direct line since her call was not answered. Janett Billow states that she was calling to let us know that the interpreter could be available at 4 and was instructed by Theo Dills that we had an appt available at 4. Since her call was not answered, we were unaware that her appt needed to be changed to 4:00. This is where things fell through the crack.   I have shown Theo Dills where to find communication from the interpreter service (appt desk, envelope beside appt). This is another way we would have know that things needed to be updated to 4:00 pm instead. Also, please don't give a direct line as there is a big chance we may not be at our desks at the time of a call and we have no way of checking vm's. I have placed an IT ticket to have our vm's removed completely.   I asked Janett Billow to send me a teams to confirm that we have an interpreter available for patient's appt next Monday.   Thank you Dr Carlota Raspberry for seeing this patient and assisting her the best we could!

## 2022-01-03 ENCOUNTER — Telehealth: Payer: Self-pay

## 2022-01-03 NOTE — Telephone Encounter (Signed)
I have called all numbers listed, patient needs to r/s her appt from next Monday to next Wednesday. That is when an interpreter is available. I have asked she or her partner call back to change this. Ok to use one of the same days on Wed.

## 2022-01-03 NOTE — Telephone Encounter (Signed)
I called pt as well to reschedule her appt, no answer, LM on phone for interpreter/pt to call back to reschedule her appt for next Wednesday.

## 2022-01-04 NOTE — Telephone Encounter (Signed)
I have attempted to contact patient again and have called all numbers listed as well as left another vm stressing the importance of a return call.

## 2022-01-07 ENCOUNTER — Ambulatory Visit: Payer: Medicare Other | Admitting: Family Medicine

## 2022-01-08 NOTE — Telephone Encounter (Signed)
I see patient called back to r/s for Wednesday, however they were not rescheduled for the correct time that the interpreter is available. I have tried to contact the patient again multiple times to try and correct this. If patient calls back they need to be in the 9:00 or 11:40 slot, those are the times the interpreter is available. If I am available please forward the call to me

## 2022-01-08 NOTE — Telephone Encounter (Signed)
PT is schedule for 9:40 tomorrow

## 2022-01-09 ENCOUNTER — Ambulatory Visit (INDEPENDENT_AMBULATORY_CARE_PROVIDER_SITE_OTHER): Payer: Medicare Other | Admitting: Family Medicine

## 2022-01-09 ENCOUNTER — Encounter: Payer: Self-pay | Admitting: Family Medicine

## 2022-01-09 VITALS — BP 128/78 | HR 50 | Temp 97.7°F | Ht 64.0 in | Wt 209.6 lb

## 2022-01-09 DIAGNOSIS — R319 Hematuria, unspecified: Secondary | ICD-10-CM | POA: Diagnosis not present

## 2022-01-09 DIAGNOSIS — R350 Frequency of micturition: Secondary | ICD-10-CM | POA: Diagnosis not present

## 2022-01-09 LAB — POCT URINALYSIS DIP (MANUAL ENTRY)
Bilirubin, UA: NEGATIVE
Blood, UA: NEGATIVE
Glucose, UA: NEGATIVE mg/dL
Ketones, POC UA: NEGATIVE mg/dL
Leukocytes, UA: NEGATIVE
Nitrite, UA: NEGATIVE
Protein Ur, POC: NEGATIVE mg/dL
Spec Grav, UA: 1.015 (ref 1.010–1.025)
Urobilinogen, UA: NEGATIVE E.U./dL — AB
pH, UA: 5 (ref 5.0–8.0)

## 2022-01-09 NOTE — Progress Notes (Signed)
Subjective:  Patient ID: Melissa Grant, female    DOB: 25-May-1975  Age: 46 y.o. MRN: [id number]  CC:  Chief Complaint  Patient presents with   Urinary Frequency    Pt states all is well    HPI Melissa Grant presents for   Follow-up of suspected urinary tract infection.  Last evaluated October 9.  Concern of urinary incontinence with some leaking at night and early morning off and on since August.  Did experience some spasms when needing to urinate with some nighttime frequency.  Trace leukocytes and blood noted on in office urinalysis October night.  Started on Macrobid 100 mg twice daily.  However urine culture was negative, no growth.  Here with tactile sign language interpreter today.  Feels a lot better since last week. No further leaking or accidents. Prior sx's have resolved. No further back pain Completed antibiotics. Minimal leaking at night at times, but noted prior.    History Patient Active Problem List   Diagnosis Date Noted   GLAUCOMA 05/22/2006   CATARACT 05/22/2006   HEARING LOSS NOS OR DEAFNESS 05/22/2006   MITRAL VALVE PROLAPSE 05/22/2006   AORTIC VALVE INSUFFICIENCY 05/22/2006   HEART BLOCK 05/22/2006   Past Medical History:  Diagnosis Date   Cataract    Congenital glaucoma of both eyes    Heart murmur    Past Surgical History:  Procedure Laterality Date   EYE SURGERY     EYE SURGERY     EYE SURGERY     EYE SURGERY     EYE SURGERY     EYE SURGERY     EYE SURGERY     EYE SURGERY     HERNIA REPAIR     Allergies  Allergen Reactions   Alphagan [Brimonidine] Itching, Swelling and Other (See Comments)    Watery eyes   Prior to Admission medications   Medication Sig Start Date End Date Taking? Authorizing Provider  Multiple Vitamin (MULTIVITAMIN) tablet Take 1 tablet by mouth daily.   Yes [provider]  bimatoprost (LUMIGAN) 0.03 % ophthalmic solution 1 drop at bedtime. Patient not taking: Reported on 09/17/2021    [provider]  dorzolamide-timolol (COSOPT) 22.3-6.8 MG/ML ophthalmic solution 1 drop 2 (two) times daily. Patient not taking: Reported on 09/17/2021    [provider]  nitrofurantoin, macrocrystal-monohydrate, (MACROBID) 100 MG capsule Take 1 capsule (100 mg total) by mouth 2 (two) times daily. Patient not taking: Reported on 01/09/2022 12/31/21   Shade Flood, MD  p[facility]oc[facility]) 1 % ophthalmic solution 1 drop 4 (four) times daily. 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   Vaping Use: Never used  Substance and Sexual Activity   Alcohol use: Yes    Alcohol/week: 3.0 standard drinks of alcohol    Types: 3 Glasses of wine per week    Comment: pt drinks rarely, but when she dose normaly has 3 sevings   Drug use: Yes    Types: Marijuana  Sexual activity: Not Currently  Other Topics Concern   Not on file  Social History Narrative   Not on file   Social Determinants of Health   Financial Resource Strain: Not on file  Food Insecurity: Not on file  Transportation Needs: Not on file  Physical Activity: Not on file  Stress: Not on file  Social Connections: Not on file  Intimate Partner Violence: Not on file    Review of Systems Per HPI.   Objective:   Vitals:   01/09/22 0942  BP: 128/78  Pulse: (!) 50  Temp: 97.7 F (36.5 C)  SpO2: 94%  Weight: 209 lb 9.6 oz (95.1 kg)  Height: 5\' 4"  (1.626 m)     Physical Exam Vitals reviewed.  Constitutional:      Appearance: Normal appearance. She is well-developed.  HENT:     Head: Normocephalic and  atraumatic.  Pulmonary:     Effort: Pulmonary effort is normal.  Abdominal:     General: There is no distension.     Palpations: Abdomen is soft.     Tenderness: There is no abdominal tenderness. There is no right CVA tenderness, left CVA tenderness, guarding or rebound.  Skin:    General: Skin is warm.  Neurological:     Mental Status: She is alert and oriented to person, place, and time.  Psychiatric:        Behavior: Behavior normal.       Results for orders placed or performed in visit on 01/09/22  POCT urinalysis dipstick  Result Value Ref Range   Color, UA yellow yellow   Clarity, UA clear clear   Glucose, UA negative negative mg/dL   Bilirubin, UA negative negative   Ketones, POC UA negative negative mg/dL   Spec Grav, UA 1.015 1.010 - 1.025   Blood, UA negative negative   pH, UA 5.0 5.0 - 8.0   Protein Ur, POC negative negative mg/dL   Urobilinogen, UA negative (A) 0.2 or 1.0 E.U./dL   Nitrite, UA Negative Negative   Leukocytes, UA Negative Negative    Assessment & Plan:  Melissa Grant is a 46 y.o. female . Urinary frequency - Plan: POCT urinalysis dipstick  Hematuria, unspecified type Resolved, asymptomatic at this time. Possible infection vs nephrolith. Improved on abx, but neg culture. As asx at this time will defer further workup, but RTC precautions given and repeat testing at CPE in 3 months.   No orders of the defined types were placed in this encounter.  Patient Instructions  Urine test normal today. Glad to hear you are doing better. If any continued leaking with urination, I would recommend meeting with urologist or your gynecologist. If any return of urinary symptoms or blood in urine - please be seen by medical provider. Let me know if there are questions.     Signed,   Merri Ray, MD [place] 01/09/22 10:28 AM

## 2022-01-09 NOTE — Patient Instructions (Addendum)
Urine test normal today. Glad to hear you are doing better. If any continued leaking with urination, I would recommend meeting with urologist or your gynecologist. If any return of urinary symptoms or blood in urine - please be seen by medical provider. Let me know if there are questions.

## 2022-01-10 ENCOUNTER — Encounter: Payer: Self-pay | Admitting: Family Medicine

## 2022-01-10 DIAGNOSIS — R7989 Other specified abnormal findings of blood chemistry: Secondary | ICD-10-CM

## 2022-01-10 NOTE — Telephone Encounter (Signed)
Patient following up due to concerns about elevated LFTs and the Urobiligen level in the urine and she is asking for some information

## 2022-04-04 ENCOUNTER — Encounter: Payer: Self-pay | Admitting: Family Medicine

## 2022-04-04 ENCOUNTER — Ambulatory Visit (INDEPENDENT_AMBULATORY_CARE_PROVIDER_SITE_OTHER): Payer: Medicare Other | Admitting: Family Medicine

## 2022-04-04 VITALS — BP 116/70 | HR 65 | Ht 64.0 in | Wt 214.4 lb

## 2022-04-04 DIAGNOSIS — H5440 Blindness, one eye, unspecified eye: Secondary | ICD-10-CM | POA: Diagnosis not present

## 2022-04-04 DIAGNOSIS — H409 Unspecified glaucoma: Secondary | ICD-10-CM

## 2022-04-04 DIAGNOSIS — Z87440 Personal history of urinary (tract) infections: Secondary | ICD-10-CM

## 2022-04-04 DIAGNOSIS — I351 Nonrheumatic aortic (valve) insufficiency: Secondary | ICD-10-CM | POA: Diagnosis not present

## 2022-04-04 DIAGNOSIS — R7989 Other specified abnormal findings of blood chemistry: Secondary | ICD-10-CM | POA: Diagnosis not present

## 2022-04-04 DIAGNOSIS — I459 Conduction disorder, unspecified: Secondary | ICD-10-CM

## 2022-04-04 DIAGNOSIS — Z1159 Encounter for screening for other viral diseases: Secondary | ICD-10-CM

## 2022-04-04 NOTE — Progress Notes (Signed)
Subjective:  Patient ID: Melissa Grant, female    DOB: 1975-10-21  Age: 47 y.o. MRN: [id number]  CC:  Chief Complaint  Patient presents with   Medical Management of Chronic Issues   HPI Melissa Grant presents for  Follow-up of chronic conditions.  Last annual exam in January 2023. Presents with tactile sign language interpreter.  History of glaucoma, referred to ophthalmology last year.  History of right eye blindness, minimal vision of the left eye with history of glaucoma. Was not able to have visit with optho. Agrees to repeat referral.   Referred to gynecology last year, due for Pap testing at that time.  Also plans to discuss family-planning with gynecology.  Followed by Dr. Hale Bogus, well woman exam in June 2023.   Aortic valve insufficiency with history of heart block.  Referred to cardiology last year for ongoing care and decision on updated imaging.  Appears there  was difficulty contacting patient for that referral based on notes 07/09/21. Difficulty contacting them as well. No CP, rare dizziness - fleeting feeling in head. No syncope. HA with second hand smoke.   Treated for urinary tract infection in October.Treated with Macrodantin at that time, completed antibiotics and improved.  Some urinary incontinence, recommended discussion with gynecologist or urologist if persistent symptoms.  Also recommended follow-up if any return of blood in urine. No return of uti sx's and no further incontinence.   Elevated LFT ALT 40 last year.  No new abd pain. No n/v. Rare alcohol- Thanksgiving last drink.  No current Rx meds.   HM: pap and cologuard last year Vaccines:  Updated covid booster recommended at pharmacy.  Flu vaccine - declines.  HIV and Hep C screening - agrees to Hep C test only.   History Patient Active Problem List   Diagnosis Date Noted   GLAUCOMA 05/22/2006   CATARACT 05/22/2006   HEARING LOSS NOS OR DEAFNESS 05/22/2006   MITRAL VALVE PROLAPSE 05/22/2006    AORTIC VALVE INSUFFICIENCY 05/22/2006   HEART BLOCK 05/22/2006   Past Medical History:  Diagnosis Date   Cataract    Congenital glaucoma of both eyes    Heart murmur    Past Surgical History:  Procedure Laterality Date   EYE SURGERY     EYE SURGERY     EYE SURGERY     EYE SURGERY     EYE SURGERY     EYE SURGERY     EYE SURGERY     EYE SURGERY     HERNIA REPAIR     Allergies  Allergen Reactions   Alphagan [Brimonidine] Itching, Swelling and Other (See Comments)    Watery eyes   Prior to Admission medications   Medication Sig Start Date End Date Taking? Authorizing Provider  Multiple Vitamin (MULTIVITAMIN) tablet Take 1 tablet by mouth daily.   Yes [provider]   Social History   Socioeconomic History   Marital status: Married    Spouse name: Not on file   Number of children: Not on file   Years of education: Not on file   Highest education level: Not on file  Occupational History   Not on file  Tobacco Use   Smoking status: Former    Types: Cigarettes   Smokeless tobacco: Former   Tobacco comments:    Briefly as a teenager  Media planner   Vaping Use: Never used  Substance and Sexual Activity   Alcohol use: Yes    Alcohol/week: 3.0 standard drinks of alcohol  Types: 3 Glasses of wine per week    Comment: pt drinks rarely, but when she dose normaly has 3 sevings   Drug use: Yes    Types: Marijuana   Sexual activity: Not Currently  Other Topics Concern   Not on file  Social History Narrative   Not on file   Social Determinants of Health   Financial Resource Strain: Not on file  Food Insecurity: Food Insecurity Present (04/04/2022)   Hunger Vital Sign    Worried About Running Out of Food in the Last Year: Sometimes true    Ran Out of Food in the Last Year: Sometimes true  Transportation Needs: Not on file  Physical Activity: Not on file  Stress: Not on file  Social Connections: Not on file  Intimate Partner Violence: Not on file     Review of Systems Per HPI  Objective:   Vitals:   04/04/22 1406  BP: 116/70  Pulse: 65  SpO2: 92%  Weight: 214 lb 6.4 oz (97.3 kg)  Height: 5\' 4"  (1.626 m)     Physical Exam Vitals reviewed.  Constitutional:      Appearance: Normal appearance. She is well-developed.  HENT:     Head: Normocephalic and atraumatic.  Eyes:     Conjunctiva/sclera: Conjunctivae normal.     Pupils: Pupils are equal, round, and reactive to light.  Neck:     Vascular: No carotid bruit.  Cardiovascular:     Rate and Rhythm: Normal rate and regular rhythm.     Heart sounds: Murmur (diastolic murmur.) heard.  Pulmonary:     Effort: Pulmonary effort is normal.     Breath sounds: Normal breath sounds.  Abdominal:     General: There is no distension.     Palpations: Abdomen is soft. There is no pulsatile mass.     Tenderness: There is no abdominal tenderness. There is no guarding or rebound.  Musculoskeletal:     Right lower leg: No edema.     Left lower leg: No edema.  Skin:    General: Skin is warm and dry.  Neurological:     Mental Status: She is alert and oriented to person, place, and time.  Psychiatric:        Mood and Affect: Mood normal.        Behavior: Behavior normal.    EKG, sinus rhythm with frequent ectopic beats although significant baseline artifact seen through multiple leads.  Peers to be sinus rhythm in lead I without apparent ST or T wave changes.  No prior EKG available for review.  Assessment & Plan:  Melissa Grant is a 47 y.o. female . Heart block - Plan: Ambulatory referral to Cardiology, EKG 12-Lead Aortic valve insufficiency, etiology of cardiac valve disease unspecified - Plan: Ambulatory referral to Cardiology, EKG 12-Lead  -Reported history of heart block, EKG sinus rhythm but significant artifact.  Heart murmur auscultated, and reported history of aortic valve insufficiency without recent imaging.  Unfortunately some difficulty with scheduling last  appointment with cardiology, repeat referral ordered.  RTC/ER precautions if new symptoms.  Blindness of right eye, unspecified left eye visual impairment category - Plan: Ambulatory referral to Ophthalmology Glaucoma, unspecified glaucoma type, unspecified laterality - Plan: Ambulatory referral to Ophthalmology  -Repeat referral to ophthalmology to decide if treatment needed, updated testing with history of glaucoma with some remaining vision on left.  Personal history of urinary infection  -Resolved including incontinence symptoms.  RTC precautions if recurs.  LFT elevation - Plan:  Comprehensive metabolic panel Need for hepatitis C screening test - Plan: Hepatitis C antibody  -Prior mild elevated LFT, possible hepatic steatosis.  Repeat labs including hep C antibody, further workup to be determined by lab results.  No orders of the defined types were placed in this encounter.  Patient Instructions  I will refer you again to the eye specialist as well as the heart specialist.  Let me know if you do not receive contact information from their office.  If there are any concerns on your labs I will let you know.  Take care.     Signed,   Merri Ray, MD [place] 04/04/22 4:14 PM

## 2022-04-04 NOTE — Patient Instructions (Signed)
I will refer you again to the eye specialist as well as the heart specialist.  Let me know if you do not receive contact information from their office.  If there are any concerns on your labs I will let you know.  Take care.

## 2022-04-05 LAB — COMPREHENSIVE METABOLIC PANEL
ALT: 28 U/L (ref 0–35)
AST: 22 U/L (ref 0–37)
Albumin: 4.5 g/dL (ref 3.5–5.2)
Alkaline Phosphatase: 71 U/L (ref 39–117)
BUN: 18 mg/dL (ref 6–23)
CO2: 27 mEq/L (ref 19–32)
Calcium: 9.8 mg/dL (ref 8.4–10.5)
Chloride: 104 mEq/L (ref 96–112)
Creatinine, Ser: 0.89 mg/dL (ref 0.40–1.20)
GFR: 77.47 mL/min (ref >60.00)
Glucose, Bld: 92 mg/dL (ref 70–99)
Potassium: 4.2 mEq/L (ref 3.5–5.1)
Sodium: 139 mEq/L (ref 135–145)
Total Bilirubin: 0.4 mg/dL (ref 0.2–1.2)
Total Protein: 7.3 g/dL (ref 6.0–8.3)

## 2022-04-05 LAB — HEPATITIS C ANTIBODY: Hepatitis C Ab: NONREACTIVE

## 2022-04-08 NOTE — Telephone Encounter (Signed)
Can you help me reschedule pt's appointment? Please see message below. You can also call me at [phone].  Thanks, Danae Chen

## 2022-04-09 ENCOUNTER — Ambulatory Visit: Payer: Medicare Other | Admitting: Cardiology

## 2022-04-15 ENCOUNTER — Encounter: Payer: Medicare Other | Admitting: Family Medicine

## 2022-04-18 ENCOUNTER — Ambulatory Visit: Payer: Medicare Other | Attending: Cardiology | Admitting: Internal Medicine

## 2022-04-18 NOTE — Progress Notes (Deleted)
Cardiology Office Note:    Date:  04/18/2022   ID:  Melissa Grant, DOB 1975-04-24, MRN [id number]  PCP:  Wendie Agreste, MD   [place] Providers Cardiologist:  None { Click to update primary MD,subspecialty MD or APP then REFRESH:1}    Referring MD: Wendie Agreste, MD   No chief complaint on file. ***  History of Present Illness:    Melissa Grant is a 47 y.o. female with a hx of ?AI ?MVP ?CHB, referral from Dr. Carlota Raspberry. I have no records.   Past Medical History:  Diagnosis Date   Cataract    Congenital glaucoma of both eyes    Heart murmur     Past Surgical History:  Procedure Laterality Date   EYE SURGERY     EYE SURGERY     EYE SURGERY     EYE SURGERY     EYE SURGERY     EYE SURGERY     EYE SURGERY     EYE SURGERY     HERNIA REPAIR      Current Medications: No outpatient medications have been marked as taking for the 04/18/22 encounter (Appointment) with Janina Mayo, MD.     Allergies:   Alphagan [brimonidine]   Social History   Socioeconomic History   Marital status: Married    Spouse name: Not on file   Number of children: Not on file   Years of education: Not on file   Highest education level: Not on file  Occupational History   Not on file  Tobacco Use   Smoking status: Former    Types: Cigarettes   Smokeless tobacco: Former   Tobacco comments:    Briefly as a teenager  Media planner   Vaping Use: Never used  Substance and Sexual Activity   Alcohol use: Yes    Alcohol/week: 3.0 standard drinks of alcohol    Types: 3 Glasses of wine per week    Comment: pt drinks rarely, but when she dose normaly has 3 sevings   Drug use: Yes    Types: Marijuana   Sexual activity: Not Currently  Other Topics Concern   Not on file  Social History Narrative   Not on file   Social Determinants of Health   Financial Resource Strain: Not on file  Food Insecurity: Food Insecurity Present (04/04/2022)   Hunger Vital Sign    Worried About  Running Out of Food in the Last Year: Sometimes true    Ran Out of Food in the Last Year: Sometimes true  Transportation Needs: Not on file  Physical Activity: Not on file  Stress: Not on file  Social Connections: Not on file     Family History: The patient's ***family history includes Diabetes in her maternal grandmother and sister; Heart disease in her father; Hyperlipidemia in her father.  ROS:   Please see the history of present illness.    *** All other systems reviewed and are negative.  EKGs/Labs/Other Studies Reviewed:    The following studies were reviewed today: ***  EKG:  EKG is *** ordered today.  The ekg ordered today demonstrates ***  Recent Labs: 04/04/2022: ALT 28; BUN 18; Creatinine, Ser 0.89; Potassium 4.2; Sodium 139  Recent Lipid Panel    Component Value Date/Time   CHOL 146 04/02/2021 1530   CHOL 141 08/27/2019 1556   TRIG 84.0 04/02/2021 1530   HDL 40.70 04/02/2021 1530   HDL 44 08/27/2019 1556   CHOLHDL 4 04/02/2021 1530  VLDL 16.8 04/02/2021 1530   LDLCALC 88 04/02/2021 1530   LDLCALC 87 08/27/2019 1556     Risk Assessment/Calculations:   {Does this patient have ATRIAL FIBRILLATION?:[phone]}  No BP recorded.  {Refresh Note OR Click here to enter BP  :1}***         Physical Exam:    VS:  There were no vitals taken for this visit.    Wt Readings from Last 3 Encounters:  04/04/22 214 lb 6.4 oz (97.3 kg)  01/09/22 209 lb 9.6 oz (95.1 kg)  12/31/21 211 lb 9.6 oz (96 kg)     GEN: *** Well nourished, well developed in no acute distress HEENT: Normal NECK: No JVD; No carotid bruits LYMPHATICS: No lymphadenopathy CARDIAC: ***RRR, no murmurs, rubs, gallops RESPIRATORY:  Clear to auscultation without rales, wheezing or rhonchi  ABDOMEN: Soft, non-tender, non-distended MUSCULOSKELETAL:  No edema; No deformity  SKIN: Warm and dry NEUROLOGIC:  Alert and oriented x 3 PSYCHIATRIC:  Normal affect   ASSESSMENT:    ?AI, mitral valve  prolapse: don't have any records or TTE. Will get an echo.   CHB?: Her EKG shows sinus rhythm with a wandering baseline, baseline was thickened.   PLAN:    In order of problems listed above:  ***      {Are you ordering a CV Procedure (e.g. stress test, cath, DCCV, TEE, etc)?   Press F2        :[id number]    Medication Adjustments/Labs and Tests Ordered: Current medicines are reviewed at length with the patient today.  Concerns regarding medicines are outlined above.  No orders of the defined types were placed in this encounter.  No orders of the defined types were placed in this encounter.   There are no Patient Instructions on file for this visit.   Signed, Janina Mayo, MD  04/18/2022 8:25 AM    [place]

## 2022-05-01 ENCOUNTER — Ambulatory Visit: Payer: Medicare Other | Admitting: Internal Medicine

## 2022-05-08 ENCOUNTER — Ambulatory Visit: Payer: Medicare Other | Admitting: Internal Medicine

## 2022-05-10 ENCOUNTER — Ambulatory Visit: Payer: Medicare Other | Attending: Cardiology | Admitting: Internal Medicine

## 2022-05-10 ENCOUNTER — Encounter: Payer: Self-pay | Admitting: Internal Medicine

## 2022-05-10 VITALS — BP 124/84 | HR 72 | Ht 67.0 in | Wt 208.0 lb

## 2022-05-10 DIAGNOSIS — Q231 Congenital insufficiency of aortic valve: Secondary | ICD-10-CM | POA: Diagnosis present

## 2022-05-10 NOTE — Progress Notes (Signed)
Cardiology Office Note:    Date:  05/10/2022   ID:  Melissa Grant, DOB [birth year], MRN [id number]  PCP:  Wendie Agreste, MD   [place] Providers Cardiologist:  None     Referring MD: Wendie Agreste, MD   No chief complaint on file. "aortic insufficiency and heart block"  History of Present Illness:    Melissa Grant is a 47 y.o. female , noted to have seen cardiology for "aortic insufficiency and heart block". EKG on 1/11 has a thick baseline. I have no echo. EKG does not show AV block.   She was in [place] for three years prior. Prior to this she was told she had a murmur. She was told she had mitral valve prolapse. 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  She denies angina, dyspnea on exertion, lower extremity edema, PND or orthopnea. Denies syncope.  Does not know about her birth parents; unclear about family hx  She is a non- smoker   Past Medical History:  Diagnosis Date   Cataract    Congenital glaucoma of both eyes    Heart murmur     Past Surgical History:  Procedure Laterality Date   EYE SURGERY     EYE SURGERY     EYE SURGERY     EYE SURGERY     EYE SURGERY     EYE SURGERY     EYE SURGERY     EYE SURGERY     HERNIA REPAIR      Current Medications: Current Outpatient Medications on File Prior to Visit  Medication Sig Dispense Refill   Multiple Vitamin (MULTIVITAMIN) tablet Take 1 tablet by mouth daily.     No current facility-administered medications on file prior to visit.    Allergies:   Alphagan [brimonidine]   Social History   Socioeconomic History   Marital status: Married    Spouse name: Not on file   Number of children: Not on file   Years of education: Not on file   Highest education level: Not on file  Occupational History   Not on file  Tobacco Use   Smoking status: Former    Types: Cigarettes   Smokeless tobacco: Former   Tobacco comments:    Briefly as a teenager  Media planner   Vaping Use: Never  used  Substance and Sexual Activity   Alcohol use: Yes    Alcohol/week: 3.0 standard drinks of alcohol    Types: 3 Glasses of wine per week    Comment: pt drinks rarely, but when she dose normaly has 3 sevings   Drug use: Yes    Types: Marijuana   Sexual activity: Not Currently  Other Topics Concern   Not on file  Social History Narrative   Not on file   Social Determinants of Health   Financial Resource Strain: Not on file  Food Insecurity: Food Insecurity Present (04/04/2022)   Hunger Vital Sign    Worried About Running Out of Food in the Last Year: Sometimes true    Ran Out of Food in the Last Year: Sometimes true  Transportation Needs: Not on file  Physical Activity: Not on file  Stress: Not on file  Social Connections: Not on file     Family History: Per above  ROS:   Please see the history of present illness.     All other systems reviewed and are negative.  EKGs/Labs/Other Studies Reviewed:    The following studies were reviewed today:  EKG:    Recent Labs: 04/04/2022: ALT 28; BUN 18; Creatinine, Ser 0.89; Potassium 4.2; Sodium 139  Recent Lipid Panel    Component Value Date/Time   CHOL 146 04/02/2021 1530   CHOL 141 08/27/2019 1556   TRIG 84.0 04/02/2021 1530   HDL 40.70 04/02/2021 1530   HDL 44 08/27/2019 1556   CHOLHDL 4 04/02/2021 1530   VLDL 16.8 04/02/2021 1530   LDLCALC 88 04/02/2021 1530   LDLCALC 87 08/27/2019 1556     Risk Assessment/Calculations:     Physical Exam:    VS:  There were no vitals taken for this visit.    Wt Readings from Last 3 Encounters:  04/04/22 214 lb 6.4 oz (97.3 kg)  01/09/22 209 lb 9.6 oz (95.1 kg)  12/31/21 211 lb 9.6 oz (96 kg)     GEN:  Well nourished, well developed in no acute distress HEENT: Normal NECK: No JVD; No carotid bruits LYMPHATICS: No lymphadenopathy CARDIAC: RRR, 2/6 SEM, rubs, gallops RESPIRATORY:  Clear to auscultation without rales, wheezing or rhonchi  ABDOMEN: Soft, non-tender,  non-distended MUSCULOSKELETAL:  No edema; No deformity  SKIN: Warm and dry NEUROLOGIC:  Alert and oriented x 3 PSYCHIATRIC:  Normal affect   ASSESSMENT:    BAV: start with a TTE, will assess ascending aorta for aortopathy PLAN:    In order of problems listed above:  TTE CT angio chest Follow up in 6 months         Medication Adjustments/Labs and Tests Ordered: Current medicines are reviewed at length with the patient today.  Concerns regarding medicines are outlined above.  No orders of the defined types were placed in this encounter.  No orders of the defined types were placed in this encounter.   There are no Patient Instructions on file for this visit.   Signed, Janina Mayo, MD  05/10/2022 12:46 PM    [place]

## 2022-05-10 NOTE — Patient Instructions (Signed)
Medication Instructions:  No Changes In Medications at this time.  *If you need a refill on your cardiac medications before your next appointment, please call your pharmacy*  Lab Work: None Ordered At This Time.  If you have labs (blood work) drawn today and your tests are completely normal, you will receive your results only by: [place] (if you have MyChart) OR A paper copy in the mail If you have any lab test that is abnormal or we need to change your treatment, we will call you to review the results.  Testing/Procedures: Your physician has requested that you have an echocardiogram. Echocardiography is a painless test that uses sound waves to create images of your heart. It provides your doctor with information about the size and shape of your heart and how well your heart's chambers and valves are working. You may receive an ultrasound enhancing agent through an IV if needed to better visualize your heart during the echo.This procedure takes approximately one hour. There are no restrictions for this procedure. This will take place at the [address], [address].   GATED CTA CHEST/AORTA- SOMEONE WILL REACH OUT TO YOU TO GET YOU SCHEDULED   Follow-Up: At [facility], you and your health needs are our priority.  As part of our continuing mission to provide you with exceptional heart care, we have created designated Provider Care Teams.  These Care Teams include your primary Cardiologist (physician) and Advanced Practice Providers (APPs -  Physician Assistants and Nurse Practitioners) who all work together to provide you with the care you need, when you need it.  Your next appointment:   6 month(s)  Provider:   Janina Mayo, MD

## 2022-06-05 ENCOUNTER — Encounter: Payer: Self-pay | Admitting: Family Medicine

## 2022-06-05 NOTE — Telephone Encounter (Signed)
Hi Shanae, Im so sorry to hear you are experience these issues.Unfortunately, you will need to call the office to make appointment . We can not treat without being diagnosed in office.     [place]  [phone]

## 2022-06-07 ENCOUNTER — Ambulatory Visit (INDEPENDENT_AMBULATORY_CARE_PROVIDER_SITE_OTHER): Payer: Medicare Other | Admitting: Family Medicine

## 2022-06-07 ENCOUNTER — Ambulatory Visit (HOSPITAL_COMMUNITY): Payer: Medicare Other | Attending: Internal Medicine

## 2022-06-07 VITALS — BP 124/70 | HR 64 | Temp 97.6°F | Ht 67.0 in | Wt 211.4 lb

## 2022-06-07 DIAGNOSIS — R32 Unspecified urinary incontinence: Secondary | ICD-10-CM

## 2022-06-07 DIAGNOSIS — R350 Frequency of micturition: Secondary | ICD-10-CM

## 2022-06-07 DIAGNOSIS — Q231 Congenital insufficiency of aortic valve: Secondary | ICD-10-CM | POA: Diagnosis present

## 2022-06-07 DIAGNOSIS — R3129 Other microscopic hematuria: Secondary | ICD-10-CM

## 2022-06-07 DIAGNOSIS — Q2381 Bicuspid aortic valve: Secondary | ICD-10-CM

## 2022-06-07 LAB — ECHOCARDIOGRAM COMPLETE
Area-P 1/2: 2.39 cm2
Height: 67 in
S' Lateral: 3.1 cm
Weight: 3382.4 oz

## 2022-06-07 LAB — POCT URINALYSIS DIPSTICK
Bilirubin, UA: NEGATIVE
Glucose, UA: NEGATIVE
Leukocytes, UA: NEGATIVE
Nitrite, UA: NEGATIVE
Protein, UA: POSITIVE — AB
Spec Grav, UA: >=1.03 — AB (ref 1.010–1.025)
Urobilinogen, UA: NEGATIVE E.U./dL — AB
pH, UA: 5.5 (ref 5.0–8.0)

## 2022-06-07 MED ORDER — NITROFURANTOIN MONOHYD MACRO 100 MG PO CAPS: 100.0000 mg | ORAL_CAPSULE | Freq: Two times a day (BID) | ORAL | 0 refills | Status: DC

## 2022-06-07 NOTE — Patient Instructions (Signed)
I will check urine culture and can treat with antibiotic but test in office only showed some blood. If you are better with this antibiotic then should just check urine test again in next month to make sure blood clears.   If not improving into next week or any worsening symptoms, be evaluated sooner. Hope you feel better soon and thanks for coming in to the office today.    Urinary Frequency, Adult Urinary frequency means urinating more often than usual. You may urinate every 1-2 hours even though you drink a normal amount of fluid and do not have a bladder infection or condition. Although you urinate more often than normal, the total amount of urine produced in a day is normal. With urinary frequency, you may have an urgent need to urinate often. The stress and anxiety of needing to find a bathroom quickly can make this urge worse. This condition may go away on its own, or you may need treatment at home. Home treatment may include bladder training, exercises, taking medicines, or making changes to your diet. Follow these instructions at home: Bladder health Your health care provider will tell you what to do to improve bladder health. You may be told to: Keep a bladder diary. Keep track of: What you eat and drink. How often you urinate. How much you urinate. Follow a bladder training program. This may include: Learning to delay going to the bathroom. Double urinating, also called voiding. This helps if you are not completely emptying your bladder. Scheduled voiding. Do Kegel exercises. Kegel exercises strengthen the muscles that help control urination, which may help the condition.  Eating and drinking Follow instructions from your health care provider about eating or drinking restrictions. You may be told to: Avoid caffeine. Drink fewer fluids, especially alcohol. Avoid drinking in the evening. Avoid foods or drinks that may irritate the bladder. These include coffee, tea, soda, artificial  sweeteners, citrus, tomato-based foods, and chocolate. Eat foods that help prevent or treat constipation. Constipation can make urinary frequency worse. You may need to take these actions to prevent or treat constipation: Drink enough fluid to keep your urine pale yellow. Take over-the-counter or prescription medicines. Eat foods that are high in fiber, such as beans, whole grains, and fresh fruits and vegetables. Limit foods that are high in fat and processed sugars, such as fried or sweet foods. General instructions Take over-the-counter and prescription medicines only as told by your health care provider. Keep all follow-up visits. This is important. Contact a health care provider if: You start urinating more often. You feel pain or irritation when you urinate. You notice blood in your urine. Your urine looks cloudy. You develop a fever. You begin vomiting. Get help right away if: You are unable to urinate. Summary Urinary frequency means urinating more often than usual. With urinary frequency, you may urinate every 1-2 hours even though you drink a normal amount of fluid and do not have a bladder infection or other bladder condition. Your health care provider may recommend that you keep a bladder diary, follow a bladder training program, or make dietary changes. If told by your health care provider, do Kegel exercises to strengthen the muscles that help control urination. Take over-the-counter and prescription medicines only as told by your health care provider. Contact a health care provider if your symptoms do not improve or get worse. This information is not intended to replace advice given to you by your health care provider. Make sure you discuss any questions  you have with your health care provider. Document Revised: 10/15/2019 Document Reviewed: 10/15/2019 Elsevier Patient Education  [place].

## 2022-06-07 NOTE — Progress Notes (Signed)
Subjective:  Patient ID: Melissa Grant, female    DOB: 1976-02-11  Age: 47 y.o. MRN: [id number]  CC:  Chief Complaint  Patient presents with   Urinary Tract Infection    Pt reports urinary incontinence since Monday      HPI Melissa Grant presents for   Urinary frequency, incontinence Here with spouse, who is interpreting with tactile sign language to patient, paper communication to spouse (also with hearing deficit). Unable to connect with video SL intperpreter.   Symptoms are similar to last October when she had a urinary tract infection with some incontinence that time.  Of note her urine culture at that time was negative, but did have trace leukocytes and positive protein at that time, as well as blood.  Blood and leukocytes cleared on repeat urinalysis October 18 after macrobid rx.  Current symptoms started 5 days ago with frequent urination and some leakage at times.  No fever, n/v, abd or back pain. Mild nausea without vomiting last week only.  Home tx: none.    History Patient Active Problem List   Diagnosis Date Noted   GLAUCOMA 05/22/2006   CATARACT 05/22/2006   HEARING LOSS NOS OR DEAFNESS 05/22/2006   MITRAL VALVE PROLAPSE 05/22/2006   AORTIC VALVE INSUFFICIENCY 05/22/2006   HEART BLOCK 05/22/2006   Past Medical History:  Diagnosis Date   Cataract    Congenital glaucoma of both eyes    Heart murmur    Past Surgical History:  Procedure Laterality Date   EYE SURGERY     EYE SURGERY     EYE SURGERY     EYE SURGERY     EYE SURGERY     EYE SURGERY     EYE SURGERY     EYE SURGERY     HERNIA REPAIR     Allergies  Allergen Reactions   Alphagan [Brimonidine] Itching, Swelling and Other (See Comments)    Watery eyes   Prior to Admission medications   Medication Sig Start Date End Date Taking? Authorizing Provider  Multiple Vitamin (MULTIVITAMIN) tablet Take 1 tablet by mouth daily.    [provider]   Social History   Socioeconomic History    Marital status: Married    Spouse name: Not on file   Number of children: Not on file   Years of education: Not on file   Highest education level: Not on file  Occupational History   Not on file  Tobacco Use   Smoking status: Former    Types: Cigarettes   Smokeless tobacco: Former   Tobacco comments:    Briefly as a teenager  Media planner   Vaping Use: Never used  Substance and Sexual Activity   Alcohol use: Yes    Alcohol/week: 3.0 standard drinks of alcohol    Types: 3 Glasses of wine per week    Comment: pt drinks rarely, but when she dose normaly has 3 sevings   Drug use: Yes    Types: Marijuana   Sexual activity: Not Currently  Other Topics Concern   Not on file  Social History Narrative   Not on file   Social Determinants of Health   Financial Resource Strain: Not on file  Food Insecurity: Food Insecurity Present (04/04/2022)   Hunger Vital Sign    Worried About Running Out of Food in the Last Year: Sometimes true    Ran Out of Food in the Last Year: Sometimes true  Transportation Needs: Not on file  Physical Activity: Not  on file  Stress: Not on file  Social Connections: Not on file  Intimate Partner Violence: Not on file    Review of Systems Per HPI  Objective:   Vitals:   06/07/22 0954  BP: 124/70  Pulse: 64  Temp: 97.6 F (36.4 C)  TempSrc: Temporal  SpO2: 96%  Weight: 211 lb 6.4 oz (95.9 kg)  Height: 5\' 7"  (1.702 m)     Physical Exam Constitutional:      Appearance: Normal appearance. She is well-developed.  HENT:     Head: Normocephalic and atraumatic.  Pulmonary:     Effort: Pulmonary effort is normal.  Abdominal:     General: There is no distension.     Palpations: Abdomen is soft.     Tenderness: There is no abdominal tenderness. There is no guarding or rebound.  Skin:    General: Skin is warm.  Neurological:     Mental Status: She is alert and oriented to person, place, and time.  Psychiatric:        Behavior: Behavior normal.       Results for orders placed or performed in visit on 06/07/22  POCT urinalysis dipstick  Result Value Ref Range   Color, UA straw    Clarity, UA clear    Glucose, UA Negative Negative   Bilirubin, UA Negative    Ketones, UA trace    Spec Grav, UA >=1.030 (A) 1.010 - 1.025   Blood, UA 3+    pH, UA 5.5 5.0 - 8.0   Protein, UA Positive (A) Negative   Urobilinogen, UA negative (A) 0.2 or 1.0 E.U./dL   Nitrite, UA Negative    Leukocytes, UA Negative Negative   Appearance     Odor       Assessment & Plan:  Melissa Grant is a 47 y.o. female . Urinary incontinence, unspecified type - Plan: POCT urinalysis dipstick, Urine Culture, nitrofurantoin, macrocrystal-monohydrate, (MACROBID) 100 MG capsule  Urinary frequency - Plan: Urine Culture, nitrofurantoin, macrocrystal-monohydrate, (MACROBID) 100 MG capsule  Microscopic hematuria - Plan: Urine Culture, nitrofurantoin, macrocrystal-monohydrate, (MACROBID) 100 MG capsule Possible currents of UTI, hemorrhagic cystitis, similar symptoms in October improved with Macrobid.  Clearing of hematuria at that time.  Repeat urine culture, negative previously.  Differential includes interstitial cystitis, urethritis, but will start Macrobid again with RTC precautions.  Consider urology eval if persistent symptoms or recurrence.  Understanding expressed with sign language and written communication.  Meds ordered this encounter  Medications   nitrofurantoin, macrocrystal-monohydrate, (MACROBID) 100 MG capsule    Sig: Take 1 capsule (100 mg total) by mouth 2 (two) times daily.    Dispense:  14 capsule    Refill:  0   Patient Instructions  I will check urine culture and can treat with antibiotic but test in office only showed some blood. If you are better with this antibiotic then should just check urine test again in next month to make sure blood clears.   If not improving into next week or any worsening symptoms, be evaluated sooner. Hope you  feel better soon and thanks for coming in to the office today.    Urinary Frequency, Adult Urinary frequency means urinating more often than usual. You may urinate every 1-2 hours even though you drink a normal amount of fluid and do not have a bladder infection or condition. Although you urinate more often than normal, the total amount of urine produced in a day is normal. With urinary frequency, you may have an urgent  need to urinate often. The stress and anxiety of needing to find a bathroom quickly can make this urge worse. This condition may go away on its own, or you may need treatment at home. Home treatment may include bladder training, exercises, taking medicines, or making changes to your diet. Follow these instructions at home: Bladder health Your health care provider will tell you what to do to improve bladder health. You may be told to: Keep a bladder diary. Keep track of: What you eat and drink. How often you urinate. How much you urinate. Follow a bladder training program. This may include: Learning to delay going to the bathroom. Double urinating, also called voiding. This helps if you are not completely emptying your bladder. Scheduled voiding. Do Kegel exercises. Kegel exercises strengthen the muscles that help control urination, which may help the condition.  Eating and drinking Follow instructions from your health care provider about eating or drinking restrictions. You may be told to: Avoid caffeine. Drink fewer fluids, especially alcohol. Avoid drinking in the evening. Avoid foods or drinks that may irritate the bladder. These include coffee, tea, soda, artificial sweeteners, citrus, tomato-based foods, and chocolate. Eat foods that help prevent or treat constipation. Constipation can make urinary frequency worse. You may need to take these actions to prevent or treat constipation: Drink enough fluid to keep your urine pale yellow. Take over-the-counter or  prescription medicines. Eat foods that are high in fiber, such as beans, whole grains, and fresh fruits and vegetables. Limit foods that are high in fat and processed sugars, such as fried or sweet foods. General instructions Take over-the-counter and prescription medicines only as told by your health care provider. Keep all follow-up visits. This is important. Contact a health care provider if: You start urinating more often. You feel pain or irritation when you urinate. You notice blood in your urine. Your urine looks cloudy. You develop a fever. You begin vomiting. Get help right away if: You are unable to urinate. Summary Urinary frequency means urinating more often than usual. With urinary frequency, you may urinate every 1-2 hours even though you drink a normal amount of fluid and do not have a bladder infection or other bladder condition. Your health care provider may recommend that you keep a bladder diary, follow a bladder training program, or make dietary changes. If told by your health care provider, do Kegel exercises to strengthen the muscles that help control urination. Take over-the-counter and prescription medicines only as told by your health care provider. Contact a health care provider if your symptoms do not improve or get worse. This information is not intended to replace advice given to you by your health care provider. Make sure you discuss any questions you have with your health care provider. Document Revised: 10/15/2019 Document Reviewed: 10/15/2019 Elsevier Patient Education  [place],   Merri Ray, MD [place], [facility] 06/07/22 10:36 AM

## 2022-06-08 LAB — URINE CULTURE
MICRO NUMBER:: 14698936
Result:: NO GROWTH
SPECIMEN QUALITY:: ADEQUATE

## 2022-07-14 ENCOUNTER — Encounter: Payer: Self-pay | Admitting: Family Medicine

## 2022-07-14 DIAGNOSIS — R32 Unspecified urinary incontinence: Secondary | ICD-10-CM

## 2022-07-14 DIAGNOSIS — R350 Frequency of micturition: Secondary | ICD-10-CM

## 2022-07-15 NOTE — Telephone Encounter (Signed)
Pt notes Abx did not help incontinence she is experiencing and is requesting to be sent tot urology/ okay to send referral? Where do you recommend referral be sent?

## 2022-09-05 NOTE — Progress Notes (Signed)
Due to language barrier (patient is deaf and blind), an tactile sign language interpreter Gerlene Burdock) was present during the history-taking and subsequent discussion (and for part of the physical exam) with this patient.  History of Present Illness: Melissa Grant is a 47 y.o. female who presents today as a new patient at [facility] [place]. All available relevant medical records have been reviewed.   Recent history:  - 12/31/2021: Seen by PCP for urinary incontinence and nocturia. UA positive for 3+ blood and trace leukocytes. Urine microscopy not performed. Treated with Macrobid for suspected UTI, however urine culture came back negative. - 01/09/2022: Seen by PCP for urinary incontinence.  - 06/07/2022: Seen by PCP for "frequent urination and some leakage at times" x5 days. UA positive for blood 3+, negative for nitrites or leukocytes. Urine microscopy not performed. Treated with Macrobid for suspected UTI, however urine culture came back negative.   Today: She reports chief complaint of urinary frequency, nocturia, and urgency. This started about 9-12 months ago. She urinates 2-5 times per night.   She also reports urge incontinence but also some urinary leakage without sensory awareness. She reports stress incontinence also but states that is fairly minimal.  She leaks daily and wears 1 pad per day on average.  Reports caffeine intake (3 cups of coffee in the morning; otherwise no significant caffeine intake for the rest of the day).  She denies dysuria, straining to void, or sensations of incomplete emptying.  She denies abdominal pain. She reports occasional right flank pain. She reports distant history of kidney stones (x2) in her 30's. Denies prior stone surgery; passed stones spontaneously.   Fall Screening: Do you usually have a device to assist in your mobility? No   Medications: Current Outpatient Medications  Medication Sig Dispense Refill   mirabegron ER  (MYRBETRIQ) 25 MG TB24 tablet Take 1 tablet (25 mg total) by mouth daily. 30 tablet 5   Multiple Vitamin (MULTIVITAMIN) tablet Take 1 tablet by mouth daily.     No current facility-administered medications for this visit.    Allergies: Allergies  Allergen Reactions   Alphagan [Brimonidine] Itching, Swelling and Other (See Comments)    Watery eyes    Past Medical History:  Diagnosis Date   Cataract    Congenital glaucoma of both eyes    Heart murmur    Past Surgical History:  Procedure Laterality Date   EYE SURGERY     EYE SURGERY     EYE SURGERY     EYE SURGERY     EYE SURGERY     EYE SURGERY     EYE SURGERY     EYE SURGERY     HERNIA REPAIR     Family History  Problem Relation Age of Onset   Hyperlipidemia Father    Heart disease Father    Diabetes Sister    Diabetes Maternal Grandmother    Social History   Socioeconomic History   Marital status: Married    Spouse name: Not on file   Number of children: Not on file   Years of education: Not on file   Highest education level: Not on file  Occupational History   Not on file  Tobacco Use   Smoking status: Never   Smokeless tobacco: Former   Tobacco comments:    Briefly as a teenager  Vaping Use   Vaping Use: Never used  Substance and Sexual Activity   Alcohol use: Yes    Alcohol/week: 3.0 standard  drinks of alcohol    Types: 3 Glasses of wine per week    Comment: pt drinks rarely, but when she dose normaly has 3 sevings   Drug use: Yes    Types: Marijuana   Sexual activity: Yes  Other Topics Concern   Not on file  Social History Narrative   Not on file   Social Determinants of Health   Financial Resource Strain: Not on file  Food Insecurity: Food Insecurity Present (04/04/2022)   Hunger Vital Sign    Worried About Programme researcher, broadcasting/film/video in the Last Year: Sometimes true    Ran Out of Food in the Last Year: Sometimes true  Transportation Needs: Not on file  Physical Activity: Not on file  Stress:  Not on file  Social Connections: Not on file  Intimate Partner Violence: Not on file    SUBJECTIVE  Limited Review of Systems Constitutional: Patient denies any unintentional weight loss  lntegumentary: Patient denies any rashes or pruritus Eyes: Patient is blind ENT: Patient is deaf Cardiovascular: Patient denies chest pain or syncope Respiratory: Patient denies shortness of breath Gastrointestinal: Patient denies nausea, vomiting, or diarrhea. Reports occasional constipation.  Musculoskeletal: Patient denies muscle cramps or weakness Neurologic: Patient denies convulsions or seizures Psychiatric: Patient denies memory problems Allergic/Immunologic: Patient denies recent allergic reaction(s) Hematologic/Lymphatic: Patient denies bleeding tendencies Endocrine: Patient denies heat/cold intolerance  GU: As per HPI.  OBJECTIVE Vitals:   09/09/22 1037  BP: 121/82  Pulse: 76  Temp: 97.8 F (36.6 C)   There is no height or weight on file to calculate BMI.  Physical Examination  Constitutional: No obvious distress; patient is non-toxic appearing  Cardiovascular: No visible lower extremity edema.  Respiratory: The patient does not have audible wheezing/stridor; respirations do not appear labored  Gastrointestinal: Abdomen non-distended Musculoskeletal: Normal ROM of UEs  Skin: No obvious rashes/open sores  Psychiatric: Answered questions appropriately  Hematologic/Lymphatic/Immunologic: No obvious bruises or sites of spontaneous bleeding  UA: negative  ASSESSMENT Urinary urgency - Plan: mirabegron ER (MYRBETRIQ) 25 MG TB24 tablet  Urinary frequency - Plan: Urinalysis, Routine w reflex microscopic, mirabegron ER (MYRBETRIQ) 25 MG TB24 tablet  Nocturia - Plan: Urinalysis, Routine w reflex microscopic, mirabegron ER (MYRBETRIQ) 25 MG TB24 tablet  Caffeine use  Urge incontinence of urine - Plan: Urinalysis, Routine w reflex microscopic, mirabegron ER (MYRBETRIQ) 25 MG TB24  tablet  Right flank pain - Plan: DG Abd 1 View, US RENAL, CANCELED: POCT urine pregnancy  History of kidney stones - Plan: DG Abd 1 View, US RENAL, CANCELED: POCT urine pregnancy  History of hematuria - Plan: Urinalysis, Routine w reflex microscopic, DG Abd 1 View, US RENAL, CANCELED: POCT urine pregnancy  Stress incontinence, female  OAB (overactive bladder)   1. OAB with urinary frequency, urgency, and urge incontinence. We discussed the symptoms of overactive bladder (OAB), which include urinary urgency, frequency, nocturia, with or without urge incontinence.   While we may not know the exact etiology of OAB, several risk factors can be identified.   We discussed the following management options in detail including potential benefits, risks, and side effects: Behavioral therapy: Modify fluid intake Decreasing bladder irritants (such as caffeine, acidic foods, spicy foods, alcohol) Urge suppression strategies Bladder retraining / timed voiding Double voiding Medication(s): Anticholinergic medications: We agreed to avoid this drug class due to side effects risks and patient history of glaucoma + cardiac issues.  Beta-3 adrenergic agonist medications: We discussed the risk for urinary retention and the potential side  effect of elevated blood pressure specific to Myrbetriq (which is more likely to occur in individuals with uncontrolled hypertension). She elected Myrbetriq over [place] due to cost.  She decided to proceed with Myrbetriq 25 mg nightly, to work on behavioral modifications including minimizing caffeine intake.  For nocturia specifically: We discussed options for further evaluation including a sleep study to evaluate for obstructive sleep apnea, which patient was informed can contribute to nocturia and/or nocturnal polyuria. She agreed to talk with her PCP about possible sleep study to evaluate for OSA.  2. Stress urinary incontinence. Not significantly bothersome per  patient.   3. Right flank pain with history of kidney stones.  - We discussed that stones may contribute to irritative voiding symptoms and could explain the 3+ blood on recent UAs x2 with 3+ blood. Negative UA / urine microscopy today.  - We agreed to proceed with RUS & KUB for further evaluation. Patient unsure whether or not she is pregnant; reports being sexually active in a heterosexual relationship without use of any contraception. Provider was informed by staff that POCT pregnancy test unavailable at our location and that patient can have it done at imaging center prior to x-ray; patient informed / aware.  - Advised adequate hydration and we discussed option to consider low oxalate diet given that calcium oxalate is the most common type of stone. Handout provided about stone prevention diet.   Will plan for follow up in 8 weeks or sooner if needed. Pt verbalized understanding and agreement. All questions were answered.  PLAN Advised the following: RUS & KUB. Start Myrbetriq 25 mg nightly at bedtime.  Minimize caffeine intake. Maintain adequate hydration. Consider low oxalate diet. Patient to talk with her PCP about possible sleep study to evaluate for OSA. Return in about 8 weeks (around 11/04/2022) for UA, PVR, & f/u with Evette Georges NP - need tactile sign language interpreter.  Orders Placed This Encounter  Procedures   DG Abd 1 View    Standing Status:   Future    Standing Expiration Date:   09/09/2023    Order Specific Question:   Reason for Exam (SYMPTOM  OR DIAGNOSIS REQUIRED)    Answer:   kidney stone    Order Specific Question:   Is patient pregnant?    Answer:   Unknown (Please Explain)    Order Specific Question:   Preferred imaging location?    Answer:   [facility]   US RENAL    Standing Status:   Future    Standing Expiration Date:   09/09/2023    Order Specific Question:   Reason for Exam (SYMPTOM  OR DIAGNOSIS REQUIRED)    Answer:   kidney stone known  or suspected    Order Specific Question:   Preferred imaging location?    Answer:   [facility]   Urinalysis, Routine w reflex microscopic    It has been explained that the patient is to follow regularly with their PCP in addition to all other providers involved in their care and to follow instructions provided by these respective offices. Patient advised to contact urology clinic if any urologic-pertaining questions, concerns, new symptoms or problems arise in the interim period.  Total time spent caring for the patient today was over 50 minutes. This includes time spent on the date of the visit reviewing the patient's chart before the visit, time spent during the visit, and time spent after the visit on documentation. Over 50% of that time was  spent in face-to-face time with this patient for direct counseling. E&M based on time and complexity of medical decision making.  There are no Patient Instructions on file for this visit.  Electronically signed by:  Donnita Falls, MSN, FNP-C, CUNP 09/09/2022 12:44 PM

## 2022-09-09 ENCOUNTER — Encounter: Payer: Self-pay | Admitting: Urology

## 2022-09-09 ENCOUNTER — Ambulatory Visit (INDEPENDENT_AMBULATORY_CARE_PROVIDER_SITE_OTHER): Payer: Medicare Other | Admitting: Urology

## 2022-09-09 VITALS — BP 121/82 | HR 76 | Temp 97.8°F

## 2022-09-09 DIAGNOSIS — N393 Stress incontinence (female) (male): Secondary | ICD-10-CM

## 2022-09-09 DIAGNOSIS — R32 Unspecified urinary incontinence: Secondary | ICD-10-CM

## 2022-09-09 DIAGNOSIS — Z87448 Personal history of other diseases of urinary system: Secondary | ICD-10-CM

## 2022-09-09 DIAGNOSIS — R351 Nocturia: Secondary | ICD-10-CM | POA: Insufficient documentation

## 2022-09-09 DIAGNOSIS — N3946 Mixed incontinence: Secondary | ICD-10-CM

## 2022-09-09 DIAGNOSIS — R109 Unspecified abdominal pain: Secondary | ICD-10-CM

## 2022-09-09 DIAGNOSIS — N3281 Overactive bladder: Secondary | ICD-10-CM

## 2022-09-09 DIAGNOSIS — Z87442 Personal history of urinary calculi: Secondary | ICD-10-CM

## 2022-09-09 DIAGNOSIS — R3915 Urgency of urination: Secondary | ICD-10-CM

## 2022-09-09 DIAGNOSIS — N3941 Urge incontinence: Secondary | ICD-10-CM

## 2022-09-09 DIAGNOSIS — R3129 Other microscopic hematuria: Secondary | ICD-10-CM

## 2022-09-09 DIAGNOSIS — R350 Frequency of micturition: Secondary | ICD-10-CM

## 2022-09-09 DIAGNOSIS — Z789 Other specified health status: Secondary | ICD-10-CM

## 2022-09-09 DIAGNOSIS — R10A1 Flank pain, right side: Secondary | ICD-10-CM

## 2022-09-09 LAB — URINALYSIS, ROUTINE W REFLEX MICROSCOPIC
Bilirubin, UA: NEGATIVE
Glucose, UA: NEGATIVE
Ketones, UA: NEGATIVE
Nitrite, UA: NEGATIVE
Protein,UA: NEGATIVE
RBC, UA: NEGATIVE
Specific Gravity, UA: 1.02 (ref 1.005–1.030)
Urobilinogen, Ur: 0.2 mg/dL (ref 0.2–1.0)
pH, UA: 5.5 (ref 5.0–7.5)

## 2022-09-09 LAB — MICROSCOPIC EXAMINATION
Bacteria, UA: NONE SEEN
RBC, Urine: NONE SEEN /hpf (ref 0–2)

## 2022-09-09 MED ORDER — MIRABEGRON ER 25 MG PO TB24: 25.0000 mg | ORAL_TABLET | Freq: Every day | ORAL | 5 refills | Status: DC

## 2022-09-17 ENCOUNTER — Encounter: Payer: Self-pay | Admitting: Family Medicine

## 2022-09-17 DIAGNOSIS — R32 Unspecified urinary incontinence: Secondary | ICD-10-CM

## 2022-09-17 DIAGNOSIS — R351 Nocturia: Secondary | ICD-10-CM

## 2022-09-17 DIAGNOSIS — R350 Frequency of micturition: Secondary | ICD-10-CM

## 2022-09-17 NOTE — Telephone Encounter (Signed)
Noted. Urology note reviewed. I will refer to sleep specialist to evaluate and then can discuss at home test if appropriate. Message sent to patient.

## 2022-10-10 ENCOUNTER — Ambulatory Visit (HOSPITAL_COMMUNITY)
Admission: RE | Admit: 2022-10-10 | Discharge: 2022-10-10 | Disposition: A | Discharge status: Home / Self Care | Payer: Medicare Other | Source: Ambulatory Visit | Attending: Urology | Admitting: Urology

## 2022-10-10 DIAGNOSIS — Z87448 Personal history of other diseases of urinary system: Secondary | ICD-10-CM | POA: Insufficient documentation

## 2022-10-10 DIAGNOSIS — Z87442 Personal history of urinary calculi: Secondary | ICD-10-CM

## 2022-10-10 DIAGNOSIS — R109 Unspecified abdominal pain: Secondary | ICD-10-CM

## 2022-10-17 ENCOUNTER — Encounter (HOSPITAL_COMMUNITY): Payer: Self-pay

## 2022-10-17 NOTE — Progress Notes (Signed)
Attempted to contact patient. Left detailed message via [employer] interpretation service line (sign language interpreter: Noreene Larsson [phone]) notifying patient that RUS & KUB showed no acute urologic findings (no GU stones, masses, or hydronephrosis) but that there was an incidental finding of an abnormal ovarian lesion which may explain her right flank pain and is concerning for ovarian cancer. Advised prompt follow up with her GYN provider to determine appropriate next steps.

## 2022-10-18 ENCOUNTER — Other Ambulatory Visit (HOSPITAL_BASED_OUTPATIENT_CLINIC_OR_DEPARTMENT_OTHER): Payer: Self-pay | Admitting: Obstetrics & Gynecology

## 2022-10-18 ENCOUNTER — Encounter (HOSPITAL_BASED_OUTPATIENT_CLINIC_OR_DEPARTMENT_OTHER): Payer: Self-pay | Admitting: *Deleted

## 2022-10-18 ENCOUNTER — Telehealth (HOSPITAL_BASED_OUTPATIENT_CLINIC_OR_DEPARTMENT_OTHER): Payer: Self-pay | Admitting: *Deleted

## 2022-10-18 ENCOUNTER — Encounter (HOSPITAL_BASED_OUTPATIENT_CLINIC_OR_DEPARTMENT_OTHER): Payer: Self-pay | Admitting: Obstetrics & Gynecology

## 2022-10-18 DIAGNOSIS — D399 Neoplasm of uncertain behavior of female genital organ, unspecified: Secondary | ICD-10-CM

## 2022-10-18 DIAGNOSIS — D4959 Neoplasm of unspecified behavior of other genitourinary organ: Secondary | ICD-10-CM

## 2022-10-18 NOTE — Telephone Encounter (Signed)
LMOVM and home number for pt to call office for recommendations and to schedule appt.

## 2022-10-24 ENCOUNTER — Ambulatory Visit (HOSPITAL_COMMUNITY)
Admission: RE | Admit: 2022-10-24 | Discharge: 2022-10-24 | Disposition: A | Discharge status: Home / Self Care | Payer: Medicare Other | Source: Ambulatory Visit | Attending: Internal Medicine | Admitting: Internal Medicine

## 2022-10-24 ENCOUNTER — Ambulatory Visit (INDEPENDENT_AMBULATORY_CARE_PROVIDER_SITE_OTHER): Payer: Medicare Other | Admitting: Student

## 2022-10-24 DIAGNOSIS — D399 Neoplasm of uncertain behavior of female genital organ, unspecified: Secondary | ICD-10-CM

## 2022-10-24 DIAGNOSIS — D3911 Neoplasm of uncertain behavior of right ovary: Secondary | ICD-10-CM

## 2022-10-24 DIAGNOSIS — D4959 Neoplasm of unspecified behavior of other genitourinary organ: Secondary | ICD-10-CM

## 2022-10-24 DIAGNOSIS — Q231 Congenital insufficiency of aortic valve: Secondary | ICD-10-CM | POA: Insufficient documentation

## 2022-10-24 MED ORDER — IOHEXOL 350 MG/ML SOLN
75.0000 mL | Freq: Once | INTRAVENOUS | Status: AC | PRN
  Administered 2022-10-24: 75 mL via INTRAVENOUS

## 2022-10-24 NOTE — Progress Notes (Signed)
History:  Ms. Melissa Grant is a 47 y.o. G2P2000 who presents to clinic today per urology referral. Patient had incidental finding of ~14cm right adexnal mass during Renal ultrasound for suspected kidney stone.    The following portions of the patient's history were reviewed and updated as appropriate: allergies, current medications, family history, past medical history, social history, past surgical history and problem list.  Review of Systems:  Review of Systems  Genitourinary:  Positive for flank pain.  Musculoskeletal:  Positive for back pain.  All other systems reviewed and are negative.     Objective:  Physical Exam LMP  (LMP Unknown)  Physical Exam Constitutional:      Appearance: Normal appearance.  HENT:     Right Ear: Decreased hearing noted.     Left Ear: Decreased hearing noted.  Eyes:     General: Visual field deficit present.  Skin:    General: Skin is warm and dry.  Neurological:     Mental Status: She is alert and oriented to person, place, and time. Mental status is at baseline.  Psychiatric:        Behavior: Behavior normal.        Thought Content: Thought content normal.        Judgment: Judgment normal.       Labs and Imaging Narrative & Impression CLINICAL DATA:  kidney stone known or suspected   EXAM: RENAL / URINARY TRACT ULTRASOUND COMPLETE   COMPARISON:  None Available.   FINDINGS: Right Kidney:   Renal measurements: 12.6 x 6.7 x 6.2 cm = volume: 272 mL. Echogenicity within normal limits. No mass or hydronephrosis visualized.   Left Kidney:   Renal measurements: 11.5 x 5.7 x 5.8 cm = volume: 198 mL. Echogenicity within normal limits. No mass or hydronephrosis visualized.   Bladder:   Appears normal for degree of bladder distention.   Other:   In the RIGHT adnexa, there is a cystic mass which measures at least 13.9 x 11.0 x 13.7 cm. It demonstrates several internal septations some of which appear mildly prominent. Possible  internal blood flow is identified within several of the septations. Mildly increased hepatic echogenicity.   IMPRESSION: 1. There is a 13.9 cm cystic mass in the RIGHT adnexa with several internal septations. 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 Given size, recommend further dedicated evaluation with CT abdomen pelvis with contrast versus pelvic MRI with and without contrast. Recommend gynecologic referral. 2. No hydronephrosis. 3. Mildly increased hepatic echogenicity as can be seen in hepatic steatosis.   These results will be called to the ordering clinician or representative by the Radiologist Assistant, and communication documented in the PACS or [employer].     Electronically Signed   By: Meda Klinefelter M.D.   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  No follow-ups on file.  Corlis Hove, NP 10/24/2022 12:19 PM

## 2022-10-28 ENCOUNTER — Encounter: Payer: Self-pay | Admitting: Internal Medicine

## 2022-10-29 ENCOUNTER — Encounter (HOSPITAL_BASED_OUTPATIENT_CLINIC_OR_DEPARTMENT_OTHER): Payer: Self-pay

## 2022-11-05 ENCOUNTER — Ambulatory Visit: Payer: Medicare Other | Admitting: Urology

## 2022-11-08 ENCOUNTER — Ambulatory Visit (HOSPITAL_BASED_OUTPATIENT_CLINIC_OR_DEPARTMENT_OTHER)
Admission: RE | Admit: 2022-11-08 | Discharge: 2022-11-08 | Disposition: A | Discharge status: Home / Self Care | Payer: Medicare Other | Source: Ambulatory Visit | Attending: Obstetrics & Gynecology | Admitting: Obstetrics & Gynecology

## 2022-11-08 ENCOUNTER — Ambulatory Visit: Payer: Medicare Other | Admitting: Internal Medicine

## 2022-11-08 DIAGNOSIS — D4959 Neoplasm of unspecified behavior of other genitourinary organ: Secondary | ICD-10-CM | POA: Diagnosis present

## 2022-11-08 DIAGNOSIS — N83201 Unspecified ovarian cyst, right side: Secondary | ICD-10-CM | POA: Diagnosis not present

## 2022-11-08 DIAGNOSIS — N7011 Chronic salpingitis: Secondary | ICD-10-CM | POA: Diagnosis not present

## 2022-11-08 DIAGNOSIS — N839 Noninflammatory disorder of ovary, fallopian tube and broad ligament, unspecified: Secondary | ICD-10-CM | POA: Diagnosis not present

## 2022-11-08 MED ORDER — GADOBUTROL 1 MMOL/ML IV SOLN
10.0000 mL | Freq: Once | INTRAVENOUS | Status: AC | PRN
  Administered 2022-11-08: 10 mL via INTRAVENOUS
  Filled 2022-11-08: qty 10

## 2022-11-11 ENCOUNTER — Ambulatory Visit (HOSPITAL_BASED_OUTPATIENT_CLINIC_OR_DEPARTMENT_OTHER): Payer: Medicare Other | Admitting: Obstetrics & Gynecology

## 2022-11-11 ENCOUNTER — Encounter (HOSPITAL_BASED_OUTPATIENT_CLINIC_OR_DEPARTMENT_OTHER): Payer: Self-pay | Admitting: Obstetrics & Gynecology

## 2022-11-11 ENCOUNTER — Ambulatory Visit (INDEPENDENT_AMBULATORY_CARE_PROVIDER_SITE_OTHER): Payer: Medicare Other | Admitting: Obstetrics & Gynecology

## 2022-11-11 VITALS — BP 136/80 | HR 66 | Wt 213.2 lb

## 2022-11-11 DIAGNOSIS — N838 Other noninflammatory disorders of ovary, fallopian tube and broad ligament: Secondary | ICD-10-CM | POA: Diagnosis not present

## 2022-11-11 DIAGNOSIS — R971 Elevated cancer antigen 125 [CA 125]: Secondary | ICD-10-CM | POA: Diagnosis not present

## 2022-11-11 DIAGNOSIS — K429 Umbilical hernia without obstruction or gangrene: Secondary | ICD-10-CM

## 2022-11-11 DIAGNOSIS — H547 Unspecified visual loss: Secondary | ICD-10-CM | POA: Diagnosis not present

## 2022-11-11 DIAGNOSIS — H9190 Unspecified hearing loss, unspecified ear: Secondary | ICD-10-CM

## 2022-11-11 NOTE — Progress Notes (Unsigned)
GYNECOLOGY  VISIT  CC:   follow up after MRI   HPI: 47 y.o. G38P2000 Married White or Caucasian female here for discussion of MRI results.  This was done last week but the results are not finalized.  On July 18, she had a renal ultrasound that showed a right cystic mass measuring 13.9 x 11.0 x 13.7cm.  Some internal septations were present that appears mildly prominent.  Ca-125 was ordered on 10/24/2022 and was 87.  Possible internal blood flow noted in the septations.  Pt is not having any pain.    Discussed with pt she will likely need to have right ovary/adnexa removed and possibly addition procedure depending on the final results of the MRI.  Will need to communicate these to her once it is finalized.  Questions answered.  Unrelated, she had a CT angiogram of the chest and aorta.  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  MRI was recommended.  It looks like Dr. Carolan Clines has recommended an MRI of the abdomen.  I do not see that this is scheduled yet.    Pt is blind and deaf and tactile ASL interpreter, Cyndia Bent, here for entire appointment.     Past Medical History:  Diagnosis Date   Cataract    Congenital glaucoma of both eyes    Heart murmur     MEDS:   Current Outpatient Medications on File Prior to Visit  Medication Sig Dispense Refill   mirabegron ER (MYRBETRIQ) 25 MG TB24 tablet Take 1 tablet (25 mg total) by mouth daily. (Patient not taking: Reported on 10/24/2022) 30 tablet 5   Multiple Vitamin (MULTIVITAMIN) tablet Take 1 tablet by mouth daily. (Patient not taking: Reported on 10/24/2022)     No current facility-administered medications on file prior to visit.    ALLERGIES: Alphagan [brimonidine]  SH:  married, non smoker  Review of Systems  Constitutional: Negative.   Genitourinary: Negative.     PHYSICAL EXAMINATION:    BP 136/80 (BP Location: Right Arm, Patient Position:  Sitting, Cuff Size: Large)   Pulse 66   Wt 213 lb 3.2 oz (96.7 kg)   LMP 10/26/2022   BMI 33.39 kg/m     General appearance: alert, cooperative and appears stated age Abdomen: soft, non-tender; bowel sounds normal; no masses,  no organomegaly Lymph:  no inguinal LAD noted  Pelvic: External genitalia:  no lesions              Urethra:  normal appearing urethra with no masses, tenderness or lesions              Bartholins and Skenes: normal                 Vagina: normal appearing vagina with normal color and discharge, no lesions              Cervix: no lesions              Bimanual Exam:  Uterus:  normal size, contour, position, consistency, mobility, non-tender, cul de sac fullness noted              Adnexa: no mass, fullness, tenderness  Chaperone, Raynelle Dick, CMA, was present for exam.  Assessment/Plan: 1. Ovarian mass, right - will need to await until MRI is finalized to make definitive plans  2. Elevated CA-125 - discussed findings with pt today  3. Umbilical hernia without obstruction and  without gangrene - may need to have this repaired as well.  Would need to coordinate with general surgery for this  4. Blindness with deafness  Total time with pt 35 minutes.

## 2022-11-19 ENCOUNTER — Ambulatory Visit: Payer: Medicare Other | Admitting: Urology

## 2022-11-27 ENCOUNTER — Encounter: Payer: Self-pay | Admitting: Internal Medicine

## 2022-11-28 ENCOUNTER — Other Ambulatory Visit: Payer: Self-pay | Admitting: Internal Medicine

## 2022-11-28 DIAGNOSIS — R1909 Other intra-abdominal and pelvic swelling, mass and lump: Secondary | ICD-10-CM

## 2022-12-06 ENCOUNTER — Encounter (HOSPITAL_BASED_OUTPATIENT_CLINIC_OR_DEPARTMENT_OTHER): Payer: Self-pay | Admitting: Obstetrics & Gynecology

## 2022-12-06 ENCOUNTER — Ambulatory Visit (HOSPITAL_BASED_OUTPATIENT_CLINIC_OR_DEPARTMENT_OTHER): Payer: Medicare Other | Admitting: Obstetrics & Gynecology

## 2022-12-06 VITALS — BP 127/63 | HR 66 | Ht 67.0 in | Wt 207.6 lb

## 2022-12-06 DIAGNOSIS — R935 Abnormal findings on diagnostic imaging of other abdominal regions, including retroperitoneum: Secondary | ICD-10-CM

## 2022-12-06 DIAGNOSIS — R971 Elevated cancer antigen 125 [CA 125]: Secondary | ICD-10-CM

## 2022-12-06 DIAGNOSIS — N7011 Chronic salpingitis: Secondary | ICD-10-CM

## 2022-12-06 NOTE — Progress Notes (Signed)
GYNECOLOGY  VISIT  CC:   discuss results and surgery  HPI: 47 y.o. G29P2000 Married White or Caucasian female here for discussion of MRI results and ca-125.  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  Ca-125 was 87.  Dr. Pricilla Holm reviewed findings and felt this did not need gyn/oncology for management.    She does have an umbilical hernia so we discussed the pros/cons of repair.  She is not having any issues with this so does not desire having this done.    Laparoscopic right salpingectomy with possible oophorectomy recommended.  Would likely do washings as well.  Procedure discussed with patient.  Hospital stay, recovery and pain management all discussed.  Risks discussed including but not limited to bleeding, risk of needing transfusion, infection, <1% risk of bowel/bladder/ureteral/vascular injury discussed as well as possible need for additional surgery if injury does occur discussed.  DVT/PE and rare risk of death discussed.  My actual complications with prior surgeries discussed.  Positioning and incision locations discussed.  Patient aware if pathology abnormal she may need additional treatment.  All questions answered.    Past Medical History:  Diagnosis Date   Cataract    Congenital glaucoma of both eyes    Heart murmur     MEDS:   Current Outpatient Medications on File Prior to Visit  Medication Sig Dispense Refill   mirabegron ER (MYRBETRIQ) 25 MG TB24 tablet Take 1 tablet (25 mg total) by mouth daily. (Patient not taking: Reported on 10/24/2022) 30 tablet 5   Multiple Vitamin (MULTIVITAMIN) tablet Take 1 tablet by mouth daily. (Patient not taking: Reported on 10/24/2022)     No current facility-administered medications on file prior to visit.    ALLERGIES: Alphagan [brimonidine]  SH:  married, non smoker  Review of Systems  Constitutional: Negative.   Genitourinary:        Right abdominal/pelvic pain    PHYSICAL EXAMINATION:    BP 127/63 (BP Location: Left Arm,  Patient Position: Sitting, Cuff Size: Normal)   Pulse 66   Ht 5\' 7"  (1.702 m)   Wt 207 lb 9 oz (94.2 kg)   LMP 10/26/2022   BMI 32.51 kg/m     Physical Exam Constitutional:      Appearance: Normal appearance.  Cardiovascular:     Rate and Rhythm: Normal rate and regular rhythm.  Pulmonary:     Effort: Pulmonary effort is normal.     Breath sounds: Normal breath sounds.  Abdominal:     Comments: Umbilical hernia present  Neurological:     General: No focal deficit present.     Mental Status: She is alert.  Psychiatric:        Mood and Affect: Mood normal.    Translator present who signed all conversation,  Otho Najjar.  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  Will await the results of the MRI for next week  2. Elevated CA-125  3. Abnormal CT of the abdomen - has MRI scheduled next week  53 minutes spent with pt in direct communication, answering questions

## 2022-12-13 ENCOUNTER — Ambulatory Visit (HOSPITAL_COMMUNITY)
Admission: RE | Admit: 2022-12-13 | Discharge: 2022-12-13 | Disposition: A | Discharge status: Home / Self Care | Payer: Medicare Other | Source: Ambulatory Visit | Attending: Internal Medicine | Admitting: Internal Medicine

## 2022-12-13 ENCOUNTER — Other Ambulatory Visit (HOSPITAL_COMMUNITY): Payer: Self-pay | Admitting: Internal Medicine

## 2022-12-13 DIAGNOSIS — R1909 Other intra-abdominal and pelvic swelling, mass and lump: Secondary | ICD-10-CM

## 2022-12-13 MED ORDER — GADOBUTROL 1 MMOL/ML IV SOLN
9.0000 mL | Freq: Once | INTRAVENOUS | Status: AC | PRN
  Administered 2022-12-13: 9 mL via INTRAVENOUS

## 2022-12-26 ENCOUNTER — Telehealth: Payer: Self-pay

## 2022-12-26 ENCOUNTER — Ambulatory Visit: Payer: Medicare Other | Admitting: Internal Medicine

## 2022-12-26 NOTE — Telephone Encounter (Signed)
Number not in service. Sent mychart message about appt time change.

## 2022-12-26 NOTE — Progress Notes (Unsigned)
Cardiology Office Note:    Date:  12/26/2022   ID:  Melissa Grant, DOB May 25, 1975, MRN [id number]  PCP:  Shade Flood, MD    [facility] Providers Cardiologist:  Maisie Fus, MD     Referring MD: Shade Flood, MD   No chief complaint on file. "aortic insufficiency and heart block"  History of Present Illness:    Melissa Grant is a 47 y.o. female , noted to have seen cardiology for "aortic insufficiency and heart block". EKG on 1/11 has a thick baseline. I have no echo. EKG does not show AV block.   She was in [place] for three years prior. Prior to this she was told she had a murmur. She was told she had mitral valve prolapse. 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  She denies angina, dyspnea on exertion, lower extremity edema, PND or orthopnea. Denies syncope.  Does not know about her birth parents; unclear about family hx  She is a non- smoker  Interval Hx 12/26/2022 She underwent TTE showing MVP with moderate MR. She has a normal aortic valve. No aneurysm. She had an incidental finding that turned out to be a  3.3 cm soft tissue mass along the tail of the pancreas follows spleen on all imaging sequences compatible with a splenule. Which is benign Otherwise, she denies symptoms of MR today.  She is asymptomatic. Discussed that she does not have bicuspid aortic valve  Past Medical History:  Diagnosis Date   Cataract    Congenital glaucoma of both eyes    Heart murmur     Past Surgical History:  Procedure Laterality Date   EYE SURGERY     EYE SURGERY     EYE SURGERY     EYE SURGERY     EYE SURGERY     EYE SURGERY     EYE SURGERY     EYE SURGERY     HERNIA REPAIR      Current Medications: Current Outpatient Medications on File Prior to Visit  Medication Sig Dispense Refill   mirabegron ER (MYRBETRIQ) 25 MG TB24 tablet Take 1 tablet (25 mg total) by mouth daily. (Patient not taking: Reported on 10/24/2022) 30 tablet 5   Multiple Vitamin  (MULTIVITAMIN) tablet Take 1 tablet by mouth daily. (Patient not taking: Reported on 10/24/2022)     No current facility-administered medications on file prior to visit.    Allergies:   Alphagan [brimonidine]   Social History   Socioeconomic History   Marital status: Married    Spouse name: Not on file   Number of children: Not on file   Years of education: Not on file   Highest education level: Not on file  Occupational History   Not on file  Tobacco Use   Smoking status: Former    Types: Cigarettes   Smokeless tobacco: Former   Tobacco comments:    Briefly as a teenager  Advertising account planner   Vaping status: Never Used  Substance and Sexual Activity   Alcohol use: Yes    Alcohol/week: 3.0 standard drinks of alcohol    Types: 3 Glasses of wine per week    Comment: pt drinks rarely, but when she dose normaly has 3 sevings   Drug use: Yes    Types: Marijuana   Sexual activity: Yes  Other Topics Concern   Not on file  Social History Narrative   Not on file   Social Determinants of Health   Financial Resource  Strain: Not on file  Food Insecurity: Food Insecurity Present (04/04/2022)   Hunger Vital Sign    Worried About Running Out of Food in the Last Year: Sometimes true    Ran Out of Food in the Last Year: Sometimes true  Transportation Needs: Not on file  Physical Activity: Not on file  Stress: Not on file  Social Connections: Not on file     Family History: Per above  ROS:   Please see the history of present illness.     All other systems reviewed and are negative.  EKGs/Labs/Other Studies Reviewed:    The following studies were reviewed today:   EKG:    Recent Labs: 04/04/2022: ALT 28; BUN 18; Creatinine, Ser 0.89; Potassium 4.2; Sodium 139 10/24/2022: Hemoglobin 13.5; Platelets 386  Recent Lipid Panel    Component Value Date/Time   CHOL 146 04/02/2021 1530   CHOL 141 08/27/2019 1556   TRIG 84.0 04/02/2021 1530   HDL 40.70 04/02/2021 1530   HDL 44  08/27/2019 1556   CHOLHDL 4 04/02/2021 1530   VLDL 16.8 04/02/2021 1530   LDLCALC 88 04/02/2021 1530   LDLCALC 87 08/27/2019 1556     Risk Assessment/Calculations:     Physical Exam:    VS:     Wt Readings from Last 3 Encounters:  12/06/22 207 lb 9 oz (94.2 kg)  11/11/22 213 lb 3.2 oz (96.7 kg)  06/07/22 211 lb 6.4 oz (95.9 kg)     GEN:  Well nourished, well developed in no acute distress HEENT: mmm NECK: No JVD; No carotid bruits LYMPHATICS: No lymphadenopathy CARDIAC: RRR,  2/6 systolic murmur, rubs, gallops RESPIRATORY:  Clear to auscultation without rales, wheezing or rhonchi  ABDOMEN: Soft, non-tender, non-distended MUSCULOSKELETAL:  No edema; No deformity  SKIN: Warm and dry NEUROLOGIC:  Alert and oriented x 3 PSYCHIATRIC:  Normal affect   ASSESSMENT:   PreOp Evaluation She is planned for salpingectomy with possible oophorectomy for hydrosalpinx. She can conduct >4 Mets. She is asymptomatic from her MVP. Her MR is not severe. She is acceptable cardiac risk for low risk surgery and anesthesia.  Mitral Valve Prolapse (MVP) Posterior leaflet moderate MR -asymptomatic -repeat echo in 2-3 years  ? 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 PLAN:    In order of problems listed above:   Follow up 12 months         Medication Adjustments/Labs and Tests Ordered: Current medicines are reviewed at length with the patient today.  Concerns regarding medicines are outlined above.  No orders of the defined types were placed in this encounter.  No orders of the defined types were placed in this encounter.   There are no Patient Instructions on file for this visit.   Signed, Maisie Fus, MD  12/26/2022 10:51 AM    [facility]

## 2022-12-27 ENCOUNTER — Ambulatory Visit: Payer: Medicare Other | Attending: Internal Medicine | Admitting: Internal Medicine

## 2022-12-27 ENCOUNTER — Encounter: Payer: Self-pay | Admitting: Internal Medicine

## 2022-12-27 VITALS — BP 110/78 | HR 50 | Ht 64.0 in | Wt 203.6 lb

## 2022-12-27 DIAGNOSIS — I359 Nonrheumatic aortic valve disorder, unspecified: Secondary | ICD-10-CM | POA: Insufficient documentation

## 2022-12-27 DIAGNOSIS — I059 Rheumatic mitral valve disease, unspecified: Secondary | ICD-10-CM | POA: Diagnosis present

## 2022-12-27 DIAGNOSIS — I459 Conduction disorder, unspecified: Secondary | ICD-10-CM | POA: Diagnosis present

## 2022-12-27 DIAGNOSIS — I341 Nonrheumatic mitral (valve) prolapse: Secondary | ICD-10-CM | POA: Diagnosis present

## 2022-12-27 NOTE — Patient Instructions (Signed)
Follow-Up: At [facility], you and your health needs are our priority.  As part of our continuing mission to provide you with exceptional heart care, we have created designated Provider Care Teams.  These Care Teams include your primary Cardiologist (physician) and Advanced Practice Providers (APPs -  Physician Assistants and Nurse Practitioners) who all work together to provide you with the care you need, when you need it.  We recommend signing up for the patient portal called "MyChart".  Sign up information is provided on this After Visit Summary.  MyChart is used to connect with patients for Virtual Visits (Telemedicine).  Patients are able to view lab/test results, encounter notes, upcoming appointments, etc.  Non-urgent messages can be sent to your provider as well.   To learn more about what you can do with MyChart, go to [email or web address].    Your next appointment:   1 year(s)  The format for your next appointment:   In Person  Provider:   Maisie Fus, MD  Follow-Up: At [facility], you and your health needs are our priority.  As part of our continuing mission to provide you with exceptional heart care, we have created designated Provider Care Teams.  These Care Teams include your primary Cardiologist (physician) and Advanced Practice Providers (APPs -  Physician Assistants and Nurse Practitioners) who all work together to provide you with the care you need, when you need it.  We recommend signing up for the patient portal called "MyChart".  Sign up information is provided on this After Visit Summary.  MyChart is used to connect with patients for Virtual Visits (Telemedicine).  Patients are able to view lab/test results, encounter notes, upcoming appointments, etc.  Non-urgent messages can be sent to your provider as well.   To learn more about what you can do with MyChart, go to [email or web address].    Your next appointment:   1 year(s)  The format for your next appointment:   In Person  Provider:   Maisie Fus, MD

## 2022-12-28 ENCOUNTER — Encounter: Payer: Self-pay | Admitting: Internal Medicine

## 2023-01-07 ENCOUNTER — Encounter (HOSPITAL_BASED_OUTPATIENT_CLINIC_OR_DEPARTMENT_OTHER): Payer: Self-pay | Admitting: Obstetrics & Gynecology

## 2023-01-15 ENCOUNTER — Encounter (HOSPITAL_BASED_OUTPATIENT_CLINIC_OR_DEPARTMENT_OTHER): Payer: Self-pay

## 2023-01-20 ENCOUNTER — Telehealth (HOSPITAL_BASED_OUTPATIENT_CLINIC_OR_DEPARTMENT_OTHER): Payer: Self-pay | Admitting: *Deleted

## 2023-01-20 NOTE — Telephone Encounter (Signed)
LMOVM for pt to call office to set up post op appt and need for pt to come by office to sign sterilization consent form.

## 2023-01-20 NOTE — Telephone Encounter (Signed)
-----   Message from [name] sent at 01/15/2023 12:58 PM EDT ----- Regarding: FW: ##Surgery 03/10/23## Good afternoon,  Hello, Cala Bradford. As a precaution, a medicaid consent is needed for the patients procedure in December.   Thanks, Dejuana ----- Message ----- From: Olevia Bowens Sent: 01/15/2023  12:25 PM EDT To: [name] Subject: RE: ##Surgery 03/10/23##                       Yes. Medicaid often only looks at the CPT code and not the dx associated with it so, [phone] looks like a sterilization to them. ----- Message ----- From: [name] Sent: 01/15/2023  12:07 PM EDT To: Tera Partridge Battle Subject: RE: ##Surgery 03/10/23##                       Patient only has Medicaid/Family planning as secondary ins. Is a consent form still needed for family planning?  Thanks, Serita Sheller ----- Message ----- From: Olevia Bowens Sent: 01/15/2023   9:41 AM EDT To: [name] Subject: RE: ##Surgery 03/10/23##                       I don't see a consent form on her. Since there is a possibility that both tubes will be removed, she needs a consent form. ----- Message ----- From: [name] Sent: 01/14/2023   5:21 PM EDT To: Harrie Jeans, RN; Reva Bores, MD; # Subject: ##Surgery 03/10/23##                           Done. Posted for 03/10/23 @[facility]  Main w/ Dr. Hyacinth Meeker & Dr. Shawnie Pons at 1025. [id number]  [id number] ----- Message ----- From: Jerene Bears, MD Sent: 01/07/2023   6:16 AM EDT To: [name] Subject: surgery                                        Dejuana, Dejuana, This pt needs to be scheduled for laparoscopic right salpingectomy, possible right salping oophorectomy, possible left salpingectomy.  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  Assistant:  laparoscopic surgeon please.  Special requests:  Just FYI.  Pt is blind and deaf.  She does typically respond well to [employer].  I think you can leave a message with her phone number as well and  she has a way to get the message.  Thank you.  Lum Keas, MD

## 2023-01-22 ENCOUNTER — Telehealth (HOSPITAL_BASED_OUTPATIENT_CLINIC_OR_DEPARTMENT_OTHER): Payer: Self-pay | Admitting: *Deleted

## 2023-01-22 ENCOUNTER — Encounter (HOSPITAL_BASED_OUTPATIENT_CLINIC_OR_DEPARTMENT_OTHER): Payer: Self-pay | Admitting: *Deleted

## 2023-01-22 NOTE — Telephone Encounter (Signed)
-----   Message from [name] sent at 01/14/2023  5:06 PM EDT ----- Regarding: ##Surgery 03/10/23## Done. Posted for 03/10/23 @[facility]  Main w/ Dr. Hyacinth Meeker & Dr. Shawnie Pons at 1025. [id number]  [id number] ----- Message ----- From: Jerene Bears, MD Sent: 01/07/2023   6:16 AM EDT To: [name] Subject: surgery                                        Melissa Grant, Melissa Grant, This pt needs to be scheduled for laparoscopic right salpingectomy, possible right salping oophorectomy, possible left salpingectomy.  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  Assistant:  laparoscopic surgeon please.  Special requests:  Just FYI.  Pt is blind and deaf.  She does typically respond well to [employer].  I think you can leave a message with her phone number as well and she has a way to get the message.  Thank you.  Lum Keas, MD

## 2023-01-22 NOTE — Telephone Encounter (Signed)
LMOVM for pt to call office. Need to schedule post op and discuss tubal papers

## 2023-01-27 ENCOUNTER — Other Ambulatory Visit (HOSPITAL_BASED_OUTPATIENT_CLINIC_OR_DEPARTMENT_OTHER): Payer: Self-pay | Admitting: Obstetrics & Gynecology

## 2023-01-27 DIAGNOSIS — N7011 Chronic salpingitis: Secondary | ICD-10-CM

## 2023-01-27 DIAGNOSIS — Z01812 Encounter for preprocedural laboratory examination: Secondary | ICD-10-CM

## 2023-01-28 ENCOUNTER — Encounter (HOSPITAL_BASED_OUTPATIENT_CLINIC_OR_DEPARTMENT_OTHER): Payer: Self-pay | Admitting: *Deleted

## 2023-02-12 ENCOUNTER — Telehealth (HOSPITAL_BASED_OUTPATIENT_CLINIC_OR_DEPARTMENT_OTHER): Payer: Self-pay | Admitting: *Deleted

## 2023-02-12 NOTE — Telephone Encounter (Signed)
-----   Message from [name] sent at 01/14/2023  5:06 PM EDT ----- Regarding: ##Surgery 03/10/23## Done. Posted for 03/10/23 @[facility]  Main w/ Dr. Hyacinth Meeker & Dr. Shawnie Pons at 1025. [id number]  [id number] ----- Message ----- From: Jerene Bears, MD Sent: 01/07/2023   6:16 AM EDT To: [name] Subject: surgery                                        Melissa Grant, Melissa Grant, This pt needs to be scheduled for laparoscopic right salpingectomy, possible right salping oophorectomy, possible left salpingectomy.  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  Assistant:  laparoscopic surgeon please.  Special requests:  Just FYI.  Pt is blind and deaf.  She does typically respond well to [employer].  I think you can leave a message with her phone number as well and she has a way to get the message.  Thank you.  Lum Keas, MD

## 2023-02-12 NOTE — Telephone Encounter (Signed)
LMOVM about scheduled post op appt. Advised to call office to reschedule if this time does not work for her.

## 2023-03-05 ENCOUNTER — Other Ambulatory Visit (HOSPITAL_BASED_OUTPATIENT_CLINIC_OR_DEPARTMENT_OTHER): Payer: Self-pay | Admitting: Obstetrics & Gynecology

## 2023-03-06 ENCOUNTER — Encounter (HOSPITAL_BASED_OUTPATIENT_CLINIC_OR_DEPARTMENT_OTHER): Payer: Self-pay | Admitting: Obstetrics & Gynecology

## 2023-03-06 NOTE — Progress Notes (Signed)
Surgical Instructions   Your procedure is scheduled on Monday, December 16th, 2024. Report to [facility] Main Entrance "A" at 8:00 A.M., then check in with the Admitting office.  Address is [address] [place], [place] [address].  Any questions or running late day of surgery: call [phone]  Questions prior to your surgery date: call [phone], Monday-Friday, 8am-4pm. If you experience any cold or flu symptoms such as cough, fever, chills, shortness of breath, etc. between now and your scheduled surgery, please notify us at the above number.     Remember:  Do not eat after midnight the night before your surgery   You may drink clear liquids until 7:15 the morning of your surgery.    Clear liquids allowed are: Water, Non-Citrus Juices (without pulp), Carbonated Beverages, Clear Tea (no milk, honey, etc.), Black Coffee Only (NO MILK, CREAM OR POWDERED CREAMER of any kind), and Gatorade.    Take these medicines the morning of surgery with A SIP OF WATER:None.    May take these medicines IF NEEDED:None.    One week prior to surgery, STOP taking any Aspirin (unless otherwise instructed by your surgeon) Aleve, Naproxen, Ibuprofen, Motrin, Advil, Goody's, BC's, all herbal medications, fish oil, and non-prescription vitamins.                     Do NOT Smoke (Tobacco/Vaping) for 24 hours prior to your procedure.    You will be asked to remove any contacts, glasses, piercing's, hearing aid's, dentures/partials prior to surgery. Please bring cases for these items if needed.    Patients discharged the day of surgery will not be allowed to drive home, and someone needs to stay with them for 24 hours.  SURGICAL WAITING ROOM VISITATION Patients may have no more than 2 support people in the waiting area - these visitors may rotate.   Pre-op nurse will coordinate an appropriate time for 1 ADULT support person, who may not rotate, to accompany patient in pre-op.  Children under the  age of 19 must have an adult with them who is not the patient and must remain in the main waiting area with an adult.  If the patient needs to stay at the hospital during part of their recovery, the visitor guidelines for inpatient rooms apply.  Please refer to the [facility] website for the visitor guidelines for any additional information.     Additional instructions for the day of surgery: DO NOT APPLY any lotions, deodorants, cologne, or perfumes.   Do not wear jewelry or makeup Do not wear nail polish, gel polish, artificial nails, or any other type of covering on natural nails (fingers and toes) Do not bring valuables to the hospital. [facility] is not responsible for valuables/personal belongings. Put on clean/comfortable clothes.  Please brush your teeth.  Ask your nurse before applying any prescription medications to the skin.

## 2023-03-07 ENCOUNTER — Encounter (HOSPITAL_COMMUNITY): Payer: Self-pay | Admitting: Obstetrics & Gynecology

## 2023-03-07 NOTE — Progress Notes (Signed)
Case: [id number] Date/Time: 03/10/23 1011   Procedure: LAPAROSCOPIC RIGHT SALPINGECTOMY AND POSSIBLE RIGHT OOPHORECTOMY (Right)   Anesthesia type: Choice   Pre-op diagnosis: hydrosalpinx.   [facility] ROOM 02 / [facility] OR   Surgeons: Jerene Bears, MD       DISCUSSION: Melissa Grant is a 47 yo female with PMH of MVP, glaucoma (has right eye blindness and minimal vision in left eye, hearing impairment (uses ASL interpreter), hydrosalpinx.  Patient follows with Cardiology for heart murmur. She had an echo on 06/07/22 which showed normal EF, moderate LVH, MVP with moderate MR, possible bicuspid aortic valve. She had a CTA of her chest which showed aortic valve was trileaflet. Last seen in clinic on 12/27/22. Advised to have repeat echo in 2-3 years. Cleared for surgery:  "PreOp Evaluation She is planned for salpingectomy with possible oophorectomy for hydrosalpinx. She can conduct >4 Mets. She is asymptomatic from her MVP. Her MR is not severe. She is acceptable cardiac risk for low risk surgery and anesthesia."   VS: Obtain DOS  PROVIDERS: Shade Flood, MD Cardiology: Carolan Clines, MD   LABS: Obtain DOS (all labs ordered are listed, but only abnormal results are displayed)  Labs Reviewed - No data to display   IMAGES:  MRI Abdomen 12/13/22:  IMPRESSION: 1. 3.3 cm soft tissue mass along the tail of the pancreas follows spleen on all imaging sequences compatible with a splenule. 2. Mild diffuse hepatic steatosis.  EKG 12/27/22:  Sinus bradycardia, rate 50 Minimal voltage criteria for LVH, may be normal variant ( R in aVL ) Anteroseptal infarct , age undetermined When compared with ECG of 02-Apr-2000 16:25, Anteroseptal infarct is now Present Nonspecific T wave abnormality now evident in Anterior leads  CV:  CTA Chest 10/24/22:  IMPRESSION: Vascular:   Tricuspid aortic valve with slight increased size of the non-coronary cusp compared to the coronary cusps.    Non-Vascular:   1. No acute or significant abnormality in the thorax. 2. 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 Evaluation is partially limited by respiratory motion artifact. Consider MRI abdomen with contrast or nuclear medicine technetium 99 sulfur colloid test for further characterization. IMPRESSIONS  Echo 06/07/22:   1. Left ventricular ejection fraction, by estimation, is 60 to 65%. The left ventricle has normal function. The left ventricle has no regional wall motion abnormalities. There is moderate left ventricular hypertrophy. Left ventricular diastolic parameters were normal.  2. Right ventricular systolic function is normal. The right ventricular size is mildly enlarged.  3. The posterior leaflet appears prolapsed. Moderate mitral valve regurgitation.  4. Possible bicuspid aortic valve. There is mild calcification of the aortic valve. Aortic valve regurgitation is not visualized.  5. The inferior vena cava is normal in size with greater than 50% respiratory variability, suggesting right atrial pressure of 3 mmHg.  Comparison(s): No prior Echocardiogram.  Conclusion(s)/Recommendation(s): Recommend CT for possible BAV. Past Medical History:  Diagnosis Date   Blind    Cataract    Congenital glaucoma of both eyes    Hearing impaired person, bilateral    Heart murmur     Past Surgical History:  Procedure Laterality Date   EYE SURGERY     EYE SURGERY     EYE SURGERY     EYE SURGERY     EYE SURGERY     EYE SURGERY  EYE SURGERY     EYE SURGERY     HERNIA REPAIR      MEDICATIONS: No current facility-administered medications for this encounter.    mirabegron ER (MYRBETRIQ) 25 MG TB24 tablet   Multiple Vitamin (MULTIVITAMIN) tablet   Marcille Blanco [facility] Surgical Short Stay/Anesthesiology [facility]  Phone [phone] 03/07/2023 9:28 AM

## 2023-03-07 NOTE — Progress Notes (Signed)
Unable to reach patient or emergency contact.  Left message through interpreter via text and voicemail.  Per previous note, patient checks my chart - left surgical instructions in MyChart.

## 2023-03-07 NOTE — Anesthesia Preprocedure Evaluation (Signed)
Anesthesia Evaluation  Patient identified by MRN, date of birth, ID band Patient awake    Reviewed: Allergy & Precautions, NPO status , Patient's Chart, lab work & pertinent test results  Airway Mallampati: III  TM Distance: >3 FB Neck ROM: Full    Dental no notable dental hx. (+) Teeth Intact, Dental Advisory Given   Pulmonary former smoker   Pulmonary exam normal breath sounds clear to auscultation       Cardiovascular Normal cardiovascular exam+ Valvular Problems/Murmurs MVP and MR  Rhythm:Regular Rate:Normal  Echo 06/07/22:    1. Left ventricular ejection fraction, by estimation, is 60 to 65%. The left ventricle has normal function. The left ventricle has no regional wall motion abnormalities. There is moderate left ventricular hypertrophy. Left ventricular diastolic parameters were normal.  2. Right ventricular systolic function is normal. The right ventricular size is mildly enlarged.  3. The posterior leaflet appears prolapsed. Moderate mitral valve regurgitation.  4. Possible bicuspid aortic valve. There is mild calcification of the aortic valve. Aortic valve regurgitation is not visualized.  5. The inferior vena cava is normal in size with greater than 50% respiratory variability, suggesting right atrial pressure of 3 mmHg    Neuro/Psych negative neurological ROS  negative psych ROS   GI/Hepatic negative GI ROS, Neg liver ROS,,,  Endo/Other  negative endocrine ROS    Renal/GU negative Renal ROS  negative genitourinary   Musculoskeletal negative musculoskeletal ROS (+)    Abdominal   Peds  Hematology negative hematology ROS (+)   Anesthesia Other Findings PMH of MVP, glaucoma (has right eye blindness and minimal vision in left eye, hearing impairment (uses ASL interpreter), hydrosalpin  Reproductive/Obstetrics                             Anesthesia Physical Anesthesia  Plan  ASA: 2  Anesthesia Plan: General   Post-op Pain Management: Tylenol PO (pre-op)*   Induction: Intravenous  PONV Risk Score and Plan: 3 and Midazolam, Dexamethasone and Ondansetron  Airway Management Planned: Oral ETT  Additional Equipment:   Intra-op Plan:   Post-operative Plan: Extubation in OR  Informed Consent: I have reviewed the patients History and Physical, chart, labs and discussed the procedure including the risks, benefits and alternatives for the proposed anesthesia with the patient or authorized representative who has indicated his/her understanding and acceptance.     Dental advisory given  Plan Discussed with: CRNA  Anesthesia Plan Comments: ( )        Anesthesia Quick Evaluation

## 2023-03-10 ENCOUNTER — Other Ambulatory Visit (HOSPITAL_BASED_OUTPATIENT_CLINIC_OR_DEPARTMENT_OTHER): Payer: Self-pay | Admitting: Obstetrics & Gynecology

## 2023-03-10 ENCOUNTER — Ambulatory Visit (HOSPITAL_COMMUNITY): Payer: Self-pay | Admitting: Medical

## 2023-03-10 ENCOUNTER — Encounter (HOSPITAL_COMMUNITY)
Admission: RE | Disposition: A | Discharge status: Home / Self Care | Payer: Self-pay | Source: Home / Self Care | Attending: Obstetrics & Gynecology

## 2023-03-10 ENCOUNTER — Ambulatory Visit (HOSPITAL_BASED_OUTPATIENT_CLINIC_OR_DEPARTMENT_OTHER): Payer: Self-pay | Admitting: Medical

## 2023-03-10 ENCOUNTER — Other Ambulatory Visit: Payer: Self-pay

## 2023-03-10 ENCOUNTER — Encounter (HOSPITAL_COMMUNITY): Payer: Self-pay | Admitting: Obstetrics & Gynecology

## 2023-03-10 ENCOUNTER — Other Ambulatory Visit (HOSPITAL_COMMUNITY): Payer: Self-pay

## 2023-03-10 ENCOUNTER — Ambulatory Visit (HOSPITAL_COMMUNITY)
Admission: RE | Admit: 2023-03-10 | Discharge: 2023-03-10 | Disposition: A | Discharge status: Home / Self Care | Payer: Medicare Other | Attending: Obstetrics & Gynecology | Admitting: Obstetrics & Gynecology

## 2023-03-10 DIAGNOSIS — N83201 Unspecified ovarian cyst, right side: Secondary | ICD-10-CM | POA: Diagnosis not present

## 2023-03-10 DIAGNOSIS — Z01812 Encounter for preprocedural laboratory examination: Secondary | ICD-10-CM

## 2023-03-10 DIAGNOSIS — K660 Peritoneal adhesions (postprocedural) (postinfection): Secondary | ICD-10-CM | POA: Insufficient documentation

## 2023-03-10 DIAGNOSIS — I517 Cardiomegaly: Secondary | ICD-10-CM | POA: Diagnosis not present

## 2023-03-10 DIAGNOSIS — Z87891 Personal history of nicotine dependence: Secondary | ICD-10-CM | POA: Diagnosis not present

## 2023-03-10 DIAGNOSIS — K760 Fatty (change of) liver, not elsewhere classified: Secondary | ICD-10-CM | POA: Insufficient documentation

## 2023-03-10 DIAGNOSIS — N7011 Chronic salpingitis: Secondary | ICD-10-CM | POA: Diagnosis present

## 2023-03-10 DIAGNOSIS — Z79899 Other long term (current) drug therapy: Secondary | ICD-10-CM | POA: Insufficient documentation

## 2023-03-10 HISTORY — DX: Unspecified visual loss: H54.7

## 2023-03-10 HISTORY — PX: LAPAROSCOPIC UNILATERAL SALPINGO OOPHERECTOMY: SHX5935

## 2023-03-10 HISTORY — DX: Unspecified hearing loss, bilateral: H91.93

## 2023-03-10 LAB — CBC
HCT: 40.4 % (ref 36.0–46.0)
Hemoglobin: 12.7 g/dL (ref 12.0–15.0)
MCH: 27.9 pg (ref 26.0–34.0)
MCHC: 31.4 g/dL (ref 30.0–36.0)
MCV: 88.6 fL (ref 80.0–100.0)
Platelets: 342 10*3/uL (ref 150–400)
RBC: 4.56 MIL/uL (ref 3.87–5.11)
RDW: 13.5 % (ref 11.5–15.5)
WBC: 6.1 10*3/uL (ref 4.0–10.5)
nRBC: 0 % (ref 0.0–0.2)

## 2023-03-10 LAB — BASIC METABOLIC PANEL
Anion gap: 7 (ref 5–15)
BUN: 15 mg/dL (ref 6–20)
CO2: 21 mmol/L — ABNORMAL LOW (ref 22–32)
Calcium: 8.9 mg/dL (ref 8.9–10.3)
Chloride: 107 mmol/L (ref 98–111)
Creatinine, Ser: 0.79 mg/dL (ref 0.44–1.00)
GFR, Estimated: >60 mL/min (ref >60)
Glucose, Bld: 167 mg/dL — ABNORMAL HIGH (ref 70–99)
Potassium: 3.8 mmol/L (ref 3.5–5.1)
Sodium: 135 mmol/L (ref 135–145)

## 2023-03-10 LAB — POCT PREGNANCY, URINE: Preg Test, Ur: NEGATIVE

## 2023-03-10 SURGERY — SALPINGO-OOPHORECTOMY, UNILATERAL, LAPAROSCOPIC
Anesthesia: General | Laterality: Right

## 2023-03-10 MED ORDER — ACETAMINOPHEN 500 MG PO TABS: 1000.0000 mg | ORAL_TABLET | ORAL | Status: AC

## 2023-03-10 MED ORDER — ONDANSETRON HCL 4 MG/2ML IJ SOLN
INTRAMUSCULAR | Status: AC
  Filled 2023-03-10: qty 2

## 2023-03-10 MED ORDER — FENTANYL CITRATE (PF) 250 MCG/5ML IJ SOLN
INTRAMUSCULAR | Status: DC | PRN
  Administered 2023-03-10: 50 ug via INTRAVENOUS
  Administered 2023-03-10: 100 ug via INTRAVENOUS
  Administered 2023-03-10: 50 ug via INTRAVENOUS

## 2023-03-10 MED ORDER — MIDAZOLAM HCL 2 MG/2ML IJ SOLN
INTRAMUSCULAR | Status: AC
  Filled 2023-03-10: qty 2

## 2023-03-10 MED ORDER — HYDROCODONE-ACETAMINOPHEN 5-325 MG PO TABS
1.0000 | ORAL_TABLET | Freq: Four times a day (QID) | ORAL | 0 refills | Status: DC | PRN
  Filled 2023-03-10: qty 15, 2d supply, fill #0

## 2023-03-10 MED ORDER — ONDANSETRON HCL 4 MG/2ML IJ SOLN
INTRAMUSCULAR | Status: DC | PRN
  Administered 2023-03-10: 4 mg via INTRAVENOUS

## 2023-03-10 MED ORDER — LIDOCAINE 2% (20 MG/ML) 5 ML SYRINGE
INTRAMUSCULAR | Status: DC | PRN
  Administered 2023-03-10: 60 mg via INTRAVENOUS

## 2023-03-10 MED ORDER — CHLORHEXIDINE GLUCONATE 0.12 % MT SOLN: 15.0000 mL | Freq: Once | OROMUCOSAL | Status: AC

## 2023-03-10 MED ORDER — BUPIVACAINE HCL (PF) 0.25 % IJ SOLN
INTRAMUSCULAR | Status: AC
  Filled 2023-03-10: qty 30

## 2023-03-10 MED ORDER — OXYCODONE HCL 5 MG PO TABS
5.0000 mg | ORAL_TABLET | Freq: Once | ORAL | Status: AC
  Administered 2023-03-10: 5 mg via ORAL

## 2023-03-10 MED ORDER — OXYCODONE HCL 5 MG PO TABS
ORAL_TABLET | ORAL | Status: AC
  Filled 2023-03-10: qty 1

## 2023-03-10 MED ORDER — DEXAMETHASONE SODIUM PHOSPHATE 10 MG/ML IJ SOLN
INTRAMUSCULAR | Status: AC
  Filled 2023-03-10: qty 1

## 2023-03-10 MED ORDER — FENTANYL CITRATE (PF) 100 MCG/2ML IJ SOLN: 25.0000 ug | INTRAMUSCULAR | Status: DC | PRN

## 2023-03-10 MED ORDER — POVIDONE-IODINE 10 % EX SWAB
2.0000 | Freq: Once | CUTANEOUS | Status: AC
  Administered 2023-03-10: 2 via TOPICAL

## 2023-03-10 MED ORDER — ORAL CARE MOUTH RINSE: 15.0000 mL | Freq: Once | OROMUCOSAL | Status: AC

## 2023-03-10 MED ORDER — ROCURONIUM BROMIDE 10 MG/ML (PF) SYRINGE
PREFILLED_SYRINGE | INTRAVENOUS | Status: DC | PRN
  Administered 2023-03-10: 30 mg via INTRAVENOUS
  Administered 2023-03-10: 20 mg via INTRAVENOUS
  Administered 2023-03-10: 50 mg via INTRAVENOUS
  Administered 2023-03-10: 20 mg via INTRAVENOUS

## 2023-03-10 MED ORDER — DEXAMETHASONE SODIUM PHOSPHATE 10 MG/ML IJ SOLN
INTRAMUSCULAR | Status: DC | PRN
  Administered 2023-03-10: 10 mg via INTRAVENOUS

## 2023-03-10 MED ORDER — FENTANYL CITRATE (PF) 250 MCG/5ML IJ SOLN
INTRAMUSCULAR | Status: AC
  Filled 2023-03-10: qty 5

## 2023-03-10 MED ORDER — MIDAZOLAM HCL 2 MG/2ML IJ SOLN
INTRAMUSCULAR | Status: DC | PRN
  Administered 2023-03-10: 2 mg via INTRAVENOUS

## 2023-03-10 MED ORDER — SUGAMMADEX SODIUM 200 MG/2ML IV SOLN
INTRAVENOUS | Status: DC | PRN
  Administered 2023-03-10: 200 mg via INTRAVENOUS

## 2023-03-10 MED ORDER — PROPOFOL 10 MG/ML IV BOLUS
INTRAVENOUS | Status: AC
  Filled 2023-03-10: qty 20

## 2023-03-10 MED ORDER — CEFAZOLIN SODIUM-DEXTROSE 2-3 GM-%(50ML) IV SOLR
INTRAVENOUS | Status: DC | PRN
  Administered 2023-03-10: 2 g via INTRAVENOUS

## 2023-03-10 MED ORDER — CHLORHEXIDINE GLUCONATE 0.12 % MT SOLN
OROMUCOSAL | Status: AC
  Administered 2023-03-10: 15 mL via OROMUCOSAL
  Filled 2023-03-10: qty 15

## 2023-03-10 MED ORDER — ROCURONIUM BROMIDE 10 MG/ML (PF) SYRINGE
PREFILLED_SYRINGE | INTRAVENOUS | Status: AC
  Filled 2023-03-10: qty 10

## 2023-03-10 MED ORDER — ACETAMINOPHEN 500 MG PO TABS: 1000.0000 mg | ORAL_TABLET | Freq: Once | ORAL | Status: DC

## 2023-03-10 MED ORDER — SODIUM CHLORIDE 0.9 % IV SOLN: INTRAVENOUS | Status: DC

## 2023-03-10 MED ORDER — KETOROLAC TROMETHAMINE 30 MG/ML IJ SOLN
INTRAMUSCULAR | Status: AC
Start: 2023-03-10 — End: ?
  Filled 2023-03-10: qty 1

## 2023-03-10 MED ORDER — IBUPROFEN 800 MG PO TABS
800.0000 mg | ORAL_TABLET | Freq: Three times a day (TID) | ORAL | 0 refills | Status: DC | PRN
Start: 2023-03-10 — End: 2023-04-27
  Filled 2023-03-10: qty 30, 10d supply, fill #0

## 2023-03-10 MED ORDER — CEFAZOLIN SODIUM-DEXTROSE 2-4 GM/100ML-% IV SOLN
INTRAVENOUS | Status: AC
  Filled 2023-03-10: qty 100

## 2023-03-10 MED ORDER — BUPIVACAINE HCL (PF) 0.25 % IJ SOLN
INTRAMUSCULAR | Status: DC | PRN
  Administered 2023-03-10: 16 mL

## 2023-03-10 MED ORDER — PROPOFOL 10 MG/ML IV BOLUS
INTRAVENOUS | Status: DC | PRN
  Administered 2023-03-10: 120 mg via INTRAVENOUS

## 2023-03-10 MED ORDER — PHENYLEPHRINE 80 MCG/ML (10ML) SYRINGE FOR IV PUSH (FOR BLOOD PRESSURE SUPPORT)
PREFILLED_SYRINGE | INTRAVENOUS | Status: AC
  Filled 2023-03-10: qty 10

## 2023-03-10 MED ORDER — EPHEDRINE SULFATE-NACL 50-0.9 MG/10ML-% IV SOSY
PREFILLED_SYRINGE | INTRAVENOUS | Status: DC | PRN
  Administered 2023-03-10 (×2): 5 mg via INTRAVENOUS

## 2023-03-10 MED ORDER — SODIUM CHLORIDE 0.9 % IR SOLN
Status: DC | PRN
  Administered 2023-03-10: 1000 mL

## 2023-03-10 MED ORDER — LIDOCAINE 2% (20 MG/ML) 5 ML SYRINGE
INTRAMUSCULAR | Status: AC
  Filled 2023-03-10: qty 5

## 2023-03-10 MED ORDER — ACETAMINOPHEN 500 MG PO TABS
ORAL_TABLET | ORAL | Status: AC
  Administered 2023-03-10: 1000 mg via ORAL
  Filled 2023-03-10: qty 2

## 2023-03-10 MED ORDER — PHENYLEPHRINE 80 MCG/ML (10ML) SYRINGE FOR IV PUSH (FOR BLOOD PRESSURE SUPPORT)
PREFILLED_SYRINGE | INTRAVENOUS | Status: DC | PRN
  Administered 2023-03-10 (×5): 80 ug via INTRAVENOUS

## 2023-03-10 MED ORDER — HYDROCODONE-ACETAMINOPHEN 5-325 MG PO TABS: 1.0000 | ORAL_TABLET | Freq: Four times a day (QID) | ORAL | 0 refills | Status: DC | PRN

## 2023-03-10 SURGICAL SUPPLY — 38 items
APPLICATOR ARISTA FLEXITIP XL (MISCELLANEOUS) IMPLANT
APPLICATOR COTTON TIP 6 STRL (MISCELLANEOUS) IMPLANT
APPLICATOR COTTON TIP 6IN STRL (MISCELLANEOUS) ×1 IMPLANT
BAG COUNTER SPONGE SURGICOUNT (BAG) ×2 IMPLANT
CABLE HIGH FREQUENCY MONO STRZ (ELECTRODE) IMPLANT
DERMABOND ADVANCED .7 DNX12 (GAUZE/BANDAGES/DRESSINGS) ×2 IMPLANT
DURAPREP 26ML APPLICATOR (WOUND CARE) ×2 IMPLANT
GLOVE BIOGEL PI IND STRL 7.0 (GLOVE) ×8 IMPLANT
GLOVE ECLIPSE 6.5 STRL STRAW (GLOVE) ×2 IMPLANT
GOWN STRL REUS W/ TWL LRG LVL3 (GOWN DISPOSABLE) ×6 IMPLANT
HEMOSTAT ARISTA ABSORB 3G PWDR (HEMOSTASIS) IMPLANT
IRRIG SUCT STRYKERFLOW 2 WTIP (MISCELLANEOUS) ×1 IMPLANT
IRRIGATION SUCT STRKRFLW 2 WTP (MISCELLANEOUS) IMPLANT
KIT PINK PAD W/HEAD ARE REST (MISCELLANEOUS) ×1 IMPLANT
KIT PINK PAD W/HEAD ARM REST (MISCELLANEOUS) ×2 IMPLANT
KIT TURNOVER KIT B (KITS) ×2 IMPLANT
LIGASURE VESSEL 5MM BLUNT TIP (ELECTROSURGICAL) IMPLANT
NDL INSUFFLATION 14GA 120MM (NEEDLE) ×2 IMPLANT
NEEDLE INSUFFLATION 14GA 120MM (NEEDLE) ×1 IMPLANT
PACK LAPAROSCOPY BASIN (CUSTOM PROCEDURE TRAY) ×2 IMPLANT
POUCH LAPAROSCOPIC INSTRUMENT (MISCELLANEOUS) ×2 IMPLANT
PROTECTOR NERVE ULNAR (MISCELLANEOUS) ×4 IMPLANT
SHEARS HARMONIC ACE PLUS 36CM (ENDOMECHANICALS) IMPLANT
SLEEVE ADV FIXATION 5X100MM (TROCAR) IMPLANT
SUT VICRYL 0 UR6 27IN ABS (SUTURE) IMPLANT
SUT VICRYL 4-0 PS2 18IN ABS (SUTURE) ×2 IMPLANT
SYR 30ML LL (SYRINGE) IMPLANT
SYR 50ML LL SCALE MARK (SYRINGE) IMPLANT
SYS BAG RETRIEVAL 10MM (BASKET) IMPLANT
SYSTEM BAG RETRIEVAL 10MM (BASKET) IMPLANT
SYSTEM CARTER THOMASON II (TROCAR) IMPLANT
TOWEL GREEN STERILE FF (TOWEL DISPOSABLE) ×4 IMPLANT
TRAY FOLEY W/BAG SLVR 14FR (SET/KITS/TRAYS/PACK) ×2 IMPLANT
TROCAR 11X100 Z THREAD (TROCAR) IMPLANT
TROCAR ADV FIXATION 5X100MM (TROCAR) ×2 IMPLANT
TROCAR XCEL NON-BLD 5MMX100MML (ENDOMECHANICALS) ×4 IMPLANT
TROCAR Z-THREAD OPTICAL 5X100M (TROCAR) IMPLANT
WARMER LAPAROSCOPE (MISCELLANEOUS) ×2 IMPLANT

## 2023-03-10 NOTE — H&P (Signed)
Denette Clayman is an 47 y.o. female G2P2 MWF here for surgical removal of right cystic mass measuring 14 x 11 x 14cm.  MRI has been done and this does appear to be most consistent with a hydrosalpinx.  Ca-125 was obtained and was 87.  I did review with gyn oncology who felt it was safe for me to proceed and not be referred to gyn/oncology for surgery.  She does have a known umbilical hernia.  We discussed repair with general surgery as well.  She is not having any issues with this so declined repair.    She has also seen cardiology for cardiac clearance.  Cardiac CT and echo were performed.  She was cleared for this procedure.  No additional recommendations were made for the procedure today.    Pertinent Gynecological History: Menses: flow is moderate Bleeding: regular menstrual cycles Contraception: none DES exposure: denies Blood transfusions: none Sexually transmitted diseases: no past history Previous GYN Procedures:  NSVD x 2   Last mammogram: never had one done Last pap: normal Date: 09/17/2021 OB History: G2, P2   Menstrual History: LMP: about 1 week ago.      Past Medical History:  Diagnosis Date   Blind    Cataract    Congenital glaucoma of both eyes    Hearing impaired person, bilateral    Heart murmur     Past Surgical History:  Procedure Laterality Date   EYE SURGERY     EYE SURGERY     EYE SURGERY     EYE SURGERY     EYE SURGERY     EYE SURGERY     EYE SURGERY     EYE SURGERY     HERNIA REPAIR      Family History  Problem Relation Age of Onset   Hyperlipidemia Father    Heart disease Father    Diabetes Sister    Diabetes Maternal Grandmother     Social History:  reports that she has quit smoking. Her smoking use included cigarettes. She has quit using smokeless tobacco. She reports current alcohol use of about 3.0 standard drinks of alcohol per week. She reports current drug use. Drug: Marijuana.  Allergies:  Allergies  Allergen Reactions   Alphagan  [Brimonidine] Itching, Swelling and Other (See Comments)    Watery eyes    Medications Prior to Admission  Medication Sig Dispense Refill Last Dose/Taking   mirabegron ER (MYRBETRIQ) 25 MG TB24 tablet Take 1 tablet (25 mg total) by mouth daily. 30 tablet 5    Multiple Vitamin (MULTIVITAMIN) tablet Take 1 tablet by mouth daily.       Review of Systems  Constitutional: Negative.   Genitourinary: Negative.     Blood pressure 138/84, pulse 60, temperature 97.6 F (36.4 C), temperature source Oral, resp. rate 18, height 5\' 4"  (1.626 m), weight 93 kg, SpO2 95%. Physical Exam Constitutional:      Appearance: Normal appearance.  Cardiovascular:     Rate and Rhythm: Normal rate and regular rhythm.  Pulmonary:     Effort: Pulmonary effort is normal.  Abdominal:     General: There is no distension.     Palpations: There is no mass.     Tenderness: There is no abdominal tenderness.     Hernia: A hernia (umbilical) is present.  Skin:    General: Skin is warm.  Neurological:     General: No focal deficit present.     Mental Status: She is alert.  Psychiatric:  Mood and Affect: Mood normal.     Results for orders placed or performed during the hospital encounter of 03/10/23 (from the past 24 hours)  CBC     Status: None   Collection Time: 03/10/23  9:30 AM  Result Value Ref Range   WBC 6.1 4.0 - 10.5 K/uL   RBC 4.56 3.87 - 5.11 MIL/uL   Hemoglobin 12.7 12.0 - 15.0 g/dL   HCT 82.9 56.2 - 13.0 %   MCV 88.6 80.0 - 100.0 fL   MCH 27.9 26.0 - 34.0 pg   MCHC 31.4 30.0 - 36.0 g/dL   RDW 86.5 78.4 - 69.6 %   Platelets 342 150 - 400 K/uL   nRBC 0.0 0.0 - 0.2 %  Basic metabolic panel     Status: Abnormal   Collection Time: 03/10/23  9:31 AM  Result Value Ref Range   Sodium 135 135 - 145 mmol/L   Potassium 3.8 3.5 - 5.1 mmol/L   Chloride 107 98 - 111 mmol/L   CO2 21 (L) 22 - 32 mmol/L   Glucose, Bld 167 (H) 70 - 99 mg/dL   BUN 15 6 - 20 mg/dL   Creatinine, Ser 2.95 0.44 -  1.00 mg/dL   Calcium 8.9 8.9 - 28.4 mg/dL   GFR, Estimated >13 >24 mL/min   Anion gap 7 5 - 15  Pregnancy, urine POC     Status: None   Collection Time: 03/10/23  9:36 AM  Result Value Ref Range   Preg Test, Ur NEGATIVE NEGATIVE    No results found.  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  She does not want ovary removed if normal and does not want the other tube removed.  We discussed pre-operatively putting possible left on consent form but she was very clear she did not want this.  She and husband are aware today if the left needs to be removed, I will need to ask for his permission.  They are both comfortable with this plan.  Questions answered.  Pt ready to proceed.    Jerene Bears 03/10/2023, 11:09 AM  Tactile interpreter Reine Just present for entire conversation with pt and spouse.

## 2023-03-10 NOTE — Transfer of Care (Signed)
Immediate Anesthesia Transfer of Care Note  Patient: Melissa Grant  Procedure(s) Performed: LAPAROSCOPIC RIGHT SALPINGECTOMY AND RIGHT OOPHORECTOMY (Right)  Patient Location: PACU  Anesthesia Type:General  Level of Consciousness: drowsy  Airway & Oxygen Therapy: Patient Spontanous Breathing  Post-op Assessment: Report given to RN and Post -op Vital signs reviewed and stable  Post vital signs: Reviewed and stable  Last Vitals:  Vitals Value Taken Time  BP 120/71 03/10/23 1500  Temp    Pulse 68 03/10/23 1504  Resp 11 03/10/23 1504  SpO2 92 % 03/10/23 1504  Vitals shown include unfiled device data.  Last Pain:  Vitals:   03/10/23 0902  TempSrc: Oral  PainSc: 0-No pain         Complications: No notable events documented.

## 2023-03-10 NOTE — Op Note (Signed)
03/10/2023  3:28 PM  PATIENT:  Melissa Grant  47 y.o. 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  An experienced assistant was required given the standard of surgical care given the complexity of the case.  This assistant was needed for exposure, dissection, suctioning, retraction, instrument exchange and for overall help during the procedure.    ANESTHESIA:   general  ESTIMATED BLOOD LOSS: 25 mL  BLOOD ADMINISTERED:none   FLUIDS: 80cc LR  UOP: 200cc concentrated UOP  SPECIMEN:  right fallopian tube and ovary, pelvic washings  DISPOSITION OF SPECIMEN:  PATHOLOGY  FINDINGS: enlarged cystic lesion on right side extending from the fallopian tube but was also partly retroperitoneal.  This lesion was adhered to the ovary and densely adhere along the inferior portion to the pelvic sidewall.  DESCRIPTION OF OPERATION: Patient is taken to the operating room. She is placed in the supine position. She is a running IV in place. Informed consent was present on the chart. SCDs on her lower extremities and functioning properly. Patient was positioned while she was awake.  Her legs were placed in the low lithotomy position in Longoria stirrups. Her arms were tucked by the side.  General endotracheal anesthesia was administered by the anesthesia staff without difficulty. Dr. Armond Hang, anesthesia, oversaw case.  Time out performed.    Clora prep was then used to prep the abdomen and Hibiclens was used to prep the inner thighs, perineum and vagina. Once 3 minutes had past the patient was draped in a normal standard fashion. The legs were lifted to the high lithotomy position. The cervix was visualized by placing a heavy weighted speculum in the  posterior aspect of the vagina and using a curved Deaver retractor to the retract anteriorly. The anterior lip of the cervix was grasped with single-tooth tenaculum.  The cervix sounded to 8 cm. Hulka clamp was passed through the cervical os and attached to the anterior lip of the cervix.  The tenaculum was removed.  The speculum was removed.   A Foley catheter was placed to straight drain.  Clear urine was noted. Legs were lowered to the low lithotomy position and attention was turned the abdomen.  The patient had an umbilical hernia that she did not want repaired.  LUQ entry was done because of this.  2mc below the costal margin, in the midclavicular line, the skin was anesthetized with Marcaine 0.25%.  Using #11 blade, 5mm skin incision was made.  The abdomen was elevated and the 5mm torchar and port were placed with direct entry approach.  Prior to this being done, an OG tube was placed by anesthesia.  This was done without difficulty.  Intra-abdominal placement was confirmed.  Then CO2 gas was attached the port (after removal of trochar) and pneumoperitoneum was achieved without difficulty. The left tube and ovary were normal.  The right tube was grossly enlarged but adhered to the sidewall and to the ovary.  The ovary was also adhered to the sidewall.  Upper abdomen was normal.    Locations for RLQ, LLQ, and suprapubic ports were noted by transillumination of the abdominal wall.  0.25% marcaine was used to anesthetize the skin.  5mm skin incisions were made in the RLQ and suprapubic region.  5mm skin incisions were made and 5mm nonbladed trochars and ports were  placed under direct visualization.  Then placement location for the LLQ port was made.  Skin anesthetized and skin incision made about 1cm in size.  A # 12 port was then placed with direct visualization.    Ureter on the right was identified.  It was located on the inferior edge of the adhered portion of this cystic mass.  This cystic mass did  appear to be arising from the fallopian tube but it was also retroperitoneal in location more superiorly.  Initial incision was made on the peritoneum superiorly.  This was on the cystic mass.  The peritoneal incision was extended inferiorly with care to note the location of the ureter.  The dissection of the peritoneum was carried along the right sidewall as well.  While retracting the mass to the midline, there were filmy adhesions that were taking down and occasional small perforating blood supply that needed incision with harmonic scalpel  The IP ligament was then isolated as it was clear that the ovary and cystic mass needed to be removed together due to adhesion. Once this isolation was complete, the IP ligament was serially clamped, cauterized and incised.  Then the fallopian tube was incised with the ligasure device close to the uterus.  Then the mesosalpinx was incised freeing the more normal portion of the fallopian tube.  At this point, most of the mass and ovary were free from the side wall except for the inferior portion where the most dense adhesions were located.  While elevating the mass and retracting towards the midline, the inferior peritoneum was incised along the inferior edge.  The patch of the ureter was watched very carefully.   Sharp dissection was done at any portion was was close to the ureter.  Once the dense adhesions were incised, the remained of the peritoneum was much easier to incise, ultimately freeing the ovary and cystic mass.  The mass was drained during the procedure to help with visualization of the path of the ureter.    Once this was completed, an laparoscopic bag was obtained.  The specimen was placed in the bag.  The bag was brought up through the LLQ incision after removal of the port.  The specimen was then removed through this incision.  Port was replaced.  Pelvic was irrigated.  Co2 gas was lowered to 8mm Hg and the pelvis watched.  The entire path of the ureter  from the pelvic brim along the right sidewall could be seen and the ureter had normal peristalsis.  Pelvis was irrigated again.  No bleeding was noted.  Arista was placed all along the right side wall.  At this point the procedure was completed.  The larger LLQ port was removed.  The fascia was closed with a Shellia Carwin fascial closure device.  The remaining instruments were removed.  Then the RLQ port was removed.  Next the pneumoperitoneum was relieved.  The patient was taken out of Trendelenburg positioning.  Several deep breaths were given to the patient's trying to any gas the abdomen and finally the suprapubic port was removed.  The skin was then closed with subcuticular stitches of 3-0 Vicryl. The skin was cleansed Dermabond was applied. Attention was then turned the vagina and the hulka clamp was removed.  The foley was removed as well.  Minimal bleeding from the vagina was noted.    Sponge, lap, needle, instrument counts were correct x2. Patient tolerated the procedure very well. She was awakened from anesthesia, extubated and taken to recovery  in stable condition.   COUNTS:  YES  PLAN OF CARE: Transfer to PACU

## 2023-03-10 NOTE — Discharge Instructions (Addendum)
Post-surgical Instructions, Outpatient Surgery  You may expect to feel dizzy, weak, and drowsy for as long as 24 hours after receiving the medicine that made you sleep (anesthetic). For the first 24 hours after your surgery:   Do not drive a car, ride a bicycle, participate in physical activities, or take public transportation until you are done taking narcotic pain medicines or as directed by Dr. Hyacinth Meeker.  Do not drink alcohol or take tranquilizers.  Do not take medicine that has not been prescribed by your physicians.  Do not sign important papers or make important decisions while on narcotic pain medicines.  Have a responsible person with you.   CARE OF INCISION If you have a bandage, you may remove it in one day.  If there are steri-strips or dermabond, just let this loosen on its own.  You may shower on the first day after your surgery.  Do not sit in a tub bath for one week. Avoid heavy lifting (more than 10 pounds/4.5 kilograms), pushing, or pulling.  Avoid activities that may risk injury to your incisions.   PAIN MANAGEMENT Motrin 800mg .  (This is the same as 4-200mg  over the counter tablets of Motrin or ibuprofen.)  You may take this every eight hours or as needed for cramping.   Vicodin 5/325mg .  For more severe pain, take one or two tablets every four to six hours as needed for pain control.  (Remember that narcotic pain medications increase your risk of constipation.  If this becomes a problem, you may take an over the counter stool softener like Colace 100mg  up to four times a day.)  DO'S AND DON'T'S Do not take a tub bath for two weeks.  You may shower on the first day after your surgery Do not do any heavy lifting for one to two weeks.  This increases the chance of bleeding. Do move around as you feel able.  Stairs are fine.  You may begin to exercise again as you feel able.  Do not lift any weights for two weeks. Do not put anything in the vagina for two weeks--no tampons,  intercourse, or douching.    REGULAR MEDIATIONS/VITAMINS: You may restart all of your regular medications as prescribed. You may restart all of your vitamins as you normally take them.    PLEASE CALL OR SEEK MEDICAL CARE IF: You have persistent nausea and vomiting.  You have trouble eating or drinking.  You have an oral temperature above 100.5.  You have constipation that is not helped by adjusting diet or increasing fluid intake. Pain medicines are a common cause of constipation.  You have heavy vaginal bleeding You have redness or drainage from your incision(s) or there is increasing pain or tenderness near or in the surgical site.

## 2023-03-10 NOTE — Anesthesia Procedure Notes (Signed)
Procedure Name: Intubation Date/Time: 03/10/2023 11:58 AM  Performed by: Thomasene Ripple, CRNAPre-anesthesia Checklist: Patient identified, Emergency Drugs available, Suction available and Patient being monitored Patient Re-evaluated:Patient Re-evaluated prior to induction Oxygen Delivery Method: Circle System Utilized Preoxygenation: Pre-oxygenation with 100% oxygen Induction Type: IV induction Ventilation: Mask ventilation without difficulty Laryngoscope Size: Glidescope and 3 Grade View: Grade I Tube type: Oral Tube size: 7.5 mm Number of attempts: 1 Airway Equipment and Method: Stylet and Oral airway Placement Confirmation: ETT inserted through vocal cords under direct vision, positive ETCO2 and breath sounds checked- equal and bilateral Secured at: 21 cm Tube secured with: Tape Dental Injury: Teeth and Oropharynx as per pre-operative assessment

## 2023-03-11 ENCOUNTER — Encounter (HOSPITAL_COMMUNITY): Payer: Self-pay | Admitting: Obstetrics & Gynecology

## 2023-03-11 ENCOUNTER — Encounter (HOSPITAL_BASED_OUTPATIENT_CLINIC_OR_DEPARTMENT_OTHER): Payer: Self-pay | Admitting: Obstetrics & Gynecology

## 2023-03-11 LAB — CYTOLOGY - NON PAP

## 2023-03-11 NOTE — Anesthesia Postprocedure Evaluation (Signed)
Anesthesia Post Note  Patient: Melissa Grant  Procedure(s) Performed: LAPAROSCOPIC RIGHT SALPINGECTOMY AND RIGHT OOPHORECTOMY (Right)     Patient location during evaluation: PACU Anesthesia Type: General Level of consciousness: awake and alert Pain management: pain level controlled Vital Signs Assessment: post-procedure vital signs reviewed and stable Respiratory status: spontaneous breathing, nonlabored ventilation, respiratory function stable and patient connected to nasal cannula oxygen Cardiovascular status: blood pressure returned to baseline and stable Postop Assessment: no apparent nausea or vomiting Anesthetic complications: no  No notable events documented.  Last Vitals:  Vitals:   03/10/23 1545 03/10/23 1600  BP: 111/68 117/63  Pulse: 64 60  Resp: 16 12  Temp:  (!) 36.4 C  SpO2: 92% 94%    Last Pain:  Vitals:   03/10/23 1545  TempSrc:   PainSc: 0-No pain                 Melissa Grant

## 2023-03-12 LAB — SURGICAL PATHOLOGY

## 2023-04-07 ENCOUNTER — Ambulatory Visit: Payer: Medicare Other | Admitting: Family Medicine

## 2023-04-17 ENCOUNTER — Encounter (HOSPITAL_BASED_OUTPATIENT_CLINIC_OR_DEPARTMENT_OTHER): Payer: Self-pay | Admitting: Obstetrics & Gynecology

## 2023-04-17 ENCOUNTER — Encounter (HOSPITAL_BASED_OUTPATIENT_CLINIC_OR_DEPARTMENT_OTHER): Payer: Self-pay

## 2023-04-25 ENCOUNTER — Encounter (HOSPITAL_BASED_OUTPATIENT_CLINIC_OR_DEPARTMENT_OTHER): Payer: Self-pay | Admitting: Obstetrics & Gynecology

## 2023-04-25 ENCOUNTER — Ambulatory Visit (HOSPITAL_BASED_OUTPATIENT_CLINIC_OR_DEPARTMENT_OTHER): Payer: Medicare Other | Admitting: Obstetrics & Gynecology

## 2023-04-25 ENCOUNTER — Ambulatory Visit: Payer: Medicare Other | Admitting: Family Medicine

## 2023-04-25 VITALS — BP 125/76 | HR 59 | Wt 197.8 lb

## 2023-04-25 DIAGNOSIS — Q150 Congenital glaucoma: Secondary | ICD-10-CM

## 2023-04-25 DIAGNOSIS — H9193 Unspecified hearing loss, bilateral: Secondary | ICD-10-CM

## 2023-04-25 DIAGNOSIS — H548 Legal blindness, as defined in USA: Secondary | ICD-10-CM

## 2023-04-25 DIAGNOSIS — Z9889 Other specified postprocedural states: Secondary | ICD-10-CM

## 2023-04-25 NOTE — Progress Notes (Unsigned)
 Marland Kitchen

## 2023-04-27 ENCOUNTER — Encounter (HOSPITAL_BASED_OUTPATIENT_CLINIC_OR_DEPARTMENT_OTHER): Payer: Self-pay | Admitting: Obstetrics & Gynecology

## 2023-04-27 DIAGNOSIS — Q150 Congenital glaucoma: Secondary | ICD-10-CM | POA: Insufficient documentation

## 2023-04-27 DIAGNOSIS — H548 Legal blindness, as defined in USA: Secondary | ICD-10-CM | POA: Insufficient documentation

## 2023-05-02 ENCOUNTER — Ambulatory Visit (INDEPENDENT_AMBULATORY_CARE_PROVIDER_SITE_OTHER): Payer: Medicare Other | Admitting: Family Medicine

## 2023-05-02 ENCOUNTER — Encounter: Payer: Self-pay | Admitting: Family Medicine

## 2023-05-02 VITALS — BP 122/84 | HR 63 | Temp 98.0°F | Wt 197.0 lb

## 2023-05-02 DIAGNOSIS — R739 Hyperglycemia, unspecified: Secondary | ICD-10-CM

## 2023-05-02 DIAGNOSIS — N7011 Chronic salpingitis: Secondary | ICD-10-CM

## 2023-05-02 DIAGNOSIS — I341 Nonrheumatic mitral (valve) prolapse: Secondary | ICD-10-CM | POA: Diagnosis not present

## 2023-05-02 DIAGNOSIS — Z87898 Personal history of other specified conditions: Secondary | ICD-10-CM

## 2023-05-02 DIAGNOSIS — H5440 Blindness, one eye, unspecified eye: Secondary | ICD-10-CM

## 2023-05-02 DIAGNOSIS — Z13220 Encounter for screening for lipoid disorders: Secondary | ICD-10-CM

## 2023-05-02 DIAGNOSIS — Z8669 Personal history of other diseases of the nervous system and sense organs: Secondary | ICD-10-CM

## 2023-05-02 LAB — LIPID PANEL
Cholesterol: 142 mg/dL (ref 0–200)
HDL: 41.3 mg/dL (ref >39.00)
LDL Cholesterol: 84 mg/dL (ref 0–99)
NonHDL: 100.67
Total CHOL/HDL Ratio: 3
Triglycerides: 84 mg/dL (ref 0.0–149.0)
VLDL: 16.8 mg/dL (ref 0.0–40.0)

## 2023-05-02 LAB — COMPREHENSIVE METABOLIC PANEL
ALT: 16 U/L (ref 0–35)
AST: 14 U/L (ref 0–37)
Albumin: 4.5 g/dL (ref 3.5–5.2)
Alkaline Phosphatase: 88 U/L (ref 39–117)
BUN: 14 mg/dL (ref 6–23)
CO2: 26 meq/L (ref 19–32)
Calcium: 9.3 mg/dL (ref 8.4–10.5)
Chloride: 103 meq/L (ref 96–112)
Creatinine, Ser: 0.79 mg/dL (ref 0.40–1.20)
GFR: 88.72 mL/min (ref >60.00)
Glucose, Bld: 238 mg/dL — ABNORMAL HIGH (ref 70–99)
Potassium: 4 meq/L (ref 3.5–5.1)
Sodium: 140 meq/L (ref 135–145)
Total Bilirubin: 0.6 mg/dL (ref 0.2–1.2)
Total Protein: 7.2 g/dL (ref 6.0–8.3)

## 2023-05-02 LAB — HEMOGLOBIN A1C: Hgb A1c MFr Bld: 9.4 % — ABNORMAL HIGH (ref 4.6–6.5)

## 2023-05-02 NOTE — Progress Notes (Signed)
 Subjective:  Patient ID: Melissa Grant, female    DOB: Jun 17, 1975  Age: 48 y.o. MRN: [id number]  CC:  Chief Complaint  Patient presents with   Medical Management of Chronic Issues    Pt is fasting;concerned after surgery what is going to be normal for her    HPI [name] presents for   Follow-up.  Here with tactile sign language interpreter.   Gynecology Dr. Cleotilde, underwent laparoscopic Right salpingectomy and right oophorectomy on December 16.  Postop appointment January 31.  Prior incontinence improved after treatment given previous right hydrosalpinx -13 x 8 cm previously.  Recurrent UTIs and urinary incontinence previously.   No menstrual cycle since her surgery but advised from GYN to contact if no cycle within 90 days from surgery.  Mitral valve prolapse with moderate mitral regurgitation.  Visit with cardiology in October of last year with plan for repeat echo in 2 to 3 years, asymptomatic.  1 year follow-up planned with Dr. Alvan.  Legally blind, referred to ophthalmology last January to see if any treatment needed with remaining vision on left, history of glaucoma. No visit - no contact known about prior appt. has not used meds for about 5 years.  Elevated LFTs: Mild elevation previously, possible hepatic steatosis.  Normal LFTs last January. Lab Results  Component Value Date   ALT 28 04/04/2022   AST 22 04/04/2022   ALKPHOS 71 04/04/2022   BILITOT 0.4 04/04/2022   Hyperglycemia: 167 on labs December 16.  No prior A1c.  Readings past 3 years of 92, 71, 104. Wt Readings from Last 3 Encounters:  05/02/23 197 lb (89.4 kg)  04/25/23 197 lb 12.8 oz (89.7 kg)  03/10/23 205 lb (93 kg)   No results found for: HGBA1C  HM:  Declines pneumonia, flu vaccine. Tdap discussed, recommended at her pharmacy.  HIV screening - declined.  Covid booster at pharmacy recommended.    History Patient Active Problem List   Diagnosis Date Noted   Legally blind 04/27/2023    Congenital glaucoma of both eyes    OAB (overactive bladder) 09/09/2022   History of kidney stones 09/09/2022   Nocturia 09/09/2022   CATARACT 05/22/2006   Hearing loss 05/22/2006   Mitral valve prolapse 05/22/2006   Past Medical History:  Diagnosis Date   Blind    Cataract    Congenital glaucoma of both eyes    Hearing impaired person, bilateral    Heart murmur    Past Surgical History:  Procedure Laterality Date   EYE SURGERY     EYE SURGERY     EYE SURGERY     EYE SURGERY     EYE SURGERY     EYE SURGERY     EYE SURGERY     EYE SURGERY     HERNIA REPAIR     LAPAROSCOPIC UNILATERAL SALPINGO OOPHERECTOMY Right 03/10/2023   Procedure: LAPAROSCOPIC RIGHT SALPINGECTOMY AND RIGHT OOPHORECTOMY;  Surgeon: Cleotilde Ronal RAMAN, MD;  Location: [facility] OR;  Service: Gynecology;  Laterality: Right;   Allergies  Allergen Reactions   Alphagan [Brimonidine] Itching, Swelling and Other (See Comments)    Watery eyes   Prior to Admission medications   Medication Sig Start Date End Date Taking? Authorizing Provider  Multiple Vitamin (MULTIVITAMIN) tablet Take 1 tablet by mouth daily. Patient not taking: Reported on 04/25/2023    [provider]   Social History   Socioeconomic History   Marital status: Married    Spouse name: Not on file  Number of children: Not on file   Years of education: Not on file   Highest education level: Not on file  Occupational History   Not on file  Tobacco Use   Smoking status: Former    Types: Cigarettes   Smokeless tobacco: Former   Tobacco comments:    Briefly as a teenager  Vaping Use   Vaping status: Never Used  Substance and Sexual Activity   Alcohol use: Yes    Alcohol/week: 3.0 standard drinks of alcohol    Types: 3 Glasses of wine per week    Comment: pt drinks rarely, but when she dose normaly has 3 sevings   Drug use: Yes    Types: Marijuana   Sexual activity: Yes  Other Topics Concern   Not on file  Social History Narrative    Not on file   Social Drivers of Health   Financial Resource Strain: Not on file  Food Insecurity: Food Insecurity Present (04/04/2022)   Hunger Vital Sign    Worried About Running Out of Food in the Last Year: Sometimes true    Ran Out of Food in the Last Year: Sometimes true  Transportation Needs: Not on file  Physical Activity: Not on file  Stress: Not on file  Social Connections: Not on file  Intimate Partner Violence: Not on file    Review of Systems  Per HPI Objective:   Vitals:   05/02/23 0959  BP: 122/84  Pulse: 63  Temp: 98 F (36.7 C)  SpO2: 95%  Weight: 197 lb (89.4 kg)     Physical Exam Vitals reviewed.  Constitutional:      Appearance: Normal appearance. She is well-developed.  HENT:     Head: Normocephalic and atraumatic.  Eyes:     Conjunctiva/sclera: Conjunctivae normal.     Pupils: Pupils are equal, round, and reactive to light.  Neck:     Vascular: No carotid bruit.  Cardiovascular:     Rate and Rhythm: Normal rate and regular rhythm.     Heart sounds: Murmur heard.  Pulmonary:     Effort: Pulmonary effort is normal.     Breath sounds: Normal breath sounds.  Abdominal:     Palpations: Abdomen is soft. There is no pulsatile mass.     Tenderness: There is no abdominal tenderness.  Musculoskeletal:     Right lower leg: No edema.     Left lower leg: No edema.  Skin:    General: Skin is warm and dry.  Neurological:     Mental Status: She is alert and oriented to person, place, and time.  Psychiatric:        Mood and Affect: Mood normal.        Behavior: Behavior normal.        Assessment & Plan:  [name] is a 48 y.o. female . Hydrosalpinx History of urinary incontinence  -Urinary continence has improved since treatment of hydrosalpinx, doing well.  Follow-up with gynecology as planned.  MVP (mitral valve prolapse)  -Status post eval with cardiology as above, continue follow-up as planned for repeat echo.  Hyperglycemia -  Plan: Comprehensive metabolic panel, Hemoglobin A1c  -Check A1c, CMP and adjust plan accordingly.  History of glaucoma Blindness of right eye, unspecified left eye visual impairment category - Plan: Ambulatory referral to Ophthalmology  -Some residual light, minimal vision of left eye.  Has not been on glaucoma treatment for years.  Will refer her to ophthalmology to decide if treatment indicated.  Screening for hyperlipidemia - Plan: Comprehensive metabolic panel, Lipid panel   No orders of the defined types were placed in this encounter.  Patient Instructions  I have placed another referral to ophthalmology to decide if any treatment needed for previous glaucoma.  If any concerns on labs today I will let you know.  I am checking a 31-month blood sugar test as well as the blood sugar and other electrolytes, cholesterol levels.  Please let me know if there are questions.  Check with your insurance, but Tdap or tetanus vaccine may be best given through your pharmacy.  Glad to hear you are doing better after the recent surgery.  Keep follow-up with specialist as planned.  Take care!    Signed,   Reyes Pines, MD [facility], [facility] 05/02/23 10:36 AM

## 2023-05-02 NOTE — Patient Instructions (Addendum)
 I have placed another referral to ophthalmology to decide if any treatment needed for previous glaucoma.  If any concerns on labs today I will let you know.  I am checking a 73-month blood sugar test as well as the blood sugar and other electrolytes, cholesterol levels.  Please let me know if there are questions.  Check with your insurance, but Tdap or tetanus vaccine may be best given through your pharmacy.  Glad to hear you are doing better after the recent surgery.  Keep follow-up with specialist as planned.  Take care!

## 2023-05-04 ENCOUNTER — Encounter: Payer: Self-pay | Admitting: Family Medicine

## 2023-05-06 NOTE — Telephone Encounter (Signed)
Patient asking if it is safe for her to have tooth extraction with current glucose level

## 2023-05-07 ENCOUNTER — Encounter (HOSPITAL_BASED_OUTPATIENT_CLINIC_OR_DEPARTMENT_OTHER): Payer: Medicare Other | Admitting: Obstetrics & Gynecology

## 2023-05-14 ENCOUNTER — Ambulatory Visit: Payer: Medicare Other | Admitting: Family Medicine

## 2023-05-26 ENCOUNTER — Ambulatory Visit: Payer: Medicare Other | Admitting: Family Medicine

## 2023-05-30 ENCOUNTER — Encounter: Payer: Self-pay | Admitting: Neurology

## 2023-05-30 ENCOUNTER — Institutional Professional Consult (permissible substitution): Payer: Medicare Other | Admitting: Neurology

## 2023-06-11 ENCOUNTER — Ambulatory Visit: Admitting: Family Medicine

## 2023-06-11 ENCOUNTER — Encounter: Payer: Self-pay | Admitting: Family Medicine

## 2023-06-11 VITALS — BP 122/78 | HR 72 | Wt 187.8 lb

## 2023-06-11 DIAGNOSIS — Z7984 Long term (current) use of oral hypoglycemic drugs: Secondary | ICD-10-CM | POA: Diagnosis not present

## 2023-06-11 DIAGNOSIS — Z794 Long term (current) use of insulin: Secondary | ICD-10-CM | POA: Diagnosis not present

## 2023-06-11 DIAGNOSIS — E1165 Type 2 diabetes mellitus with hyperglycemia: Secondary | ICD-10-CM | POA: Diagnosis not present

## 2023-06-11 LAB — MICROALBUMIN / CREATININE URINE RATIO
Creatinine,U: 62.3 mg/dL
Microalb Creat Ratio: 27.7 mg/g (ref 0.0–30.0)
Microalb, Ur: 1.7 mg/dL (ref 0.0–1.9)

## 2023-06-11 MED ORDER — FREESTYLE LIBRE 3 SENSOR MISC: 1.0000 | 1 refills | Status: DC

## 2023-06-11 MED ORDER — METFORMIN HCL 500 MG PO TABS: 500.0000 mg | ORAL_TABLET | Freq: Two times a day (BID) | ORAL | 1 refills | Status: DC

## 2023-06-11 NOTE — Patient Instructions (Addendum)
 Start metformin daily for 1 week and if tolerated increase to twice per day.  Diet as below and we can check labs again next visit.  I will order a glucose monitor to see if that will be covered to check your blood sugar.  Repeat labs in 2 months.   Let me know if there are questions or any new symptoms.    Type 2 Diabetes Mellitus, Self-Care, Adult Caring for yourself after you have been diagnosed with type 2 diabetes (type 2 diabetes mellitus) means keeping your blood sugar (glucose) under control with a balance of: Nutrition. Exercise. Lifestyle changes. Medicines or insulin, if needed. Support from your team of health care providers and others. What are the risks? Having type 2 diabetes can put you at risk for other long-term (chronic) conditions, such as heart disease and kidney disease. Your health care provider may prescribe medicines to help prevent complications from diabetes. How to monitor your blood glucose  Check your blood glucose every day or as often as told by your health care provider. Have your A1C (hemoglobin A1C) level checked two or more times a year, or as often as told by your health care provider. Your health care provider will set personalized treatment goals for you. Generally, the goal of treatment is to maintain the following blood glucose levels: Before meals: 80-130 mg/dL (1.6-1.0 mmol/L). After meals: below 180 mg/dL (10 mmol/L). A1C level: less than 7%. How to manage hyperglycemia and hypoglycemia Hyperglycemia symptoms Hyperglycemia, also called high blood glucose, occurs when blood glucose is too high. Make sure you know the early signs of hyperglycemia, such as: Increased thirst. Hunger. Feeling very tired. Needing to urinate more often than usual. Blurry vision. Hypoglycemia symptoms Hypoglycemia, also called low blood glucose, occurs with a blood glucose level at or below 70 mg/dL (3.9 mmol/L). Diabetes medicines lower your blood glucose and can  cause hypoglycemia. The risk for hypoglycemia increases during or after exercise, during sleep, during illness, and when skipping meals or not eating for a long time (fasting). It is important to know the symptoms of hypoglycemia and treat it right away. Always have a 15-gram rapid-acting carbohydrate snack with you to treat low blood glucose. Family members and close friends should also know the symptoms and understand how to treat hypoglycemia, in case you are not able to treat yourself. Symptoms may include: Hunger. Anxiety. Sweating and feeling clammy. Dizziness or feeling light-headed. Sleepiness. Increased heart rate. Irritability. Tingling or numbness around the mouth, lips, or tongue. Restless sleep. Severe hypoglycemia is when your blood glucose level is at or below 54 mg/dL (3 mmol/L). Severe hypoglycemia is an emergency. Do not wait to see if the symptoms will go away. Get medical help right away. Call your local emergency services (911 in the [place] Do not drive yourself to the hospital. If you have severe hypoglycemia and you cannot eat or drink, you may need glucagon. A family member or close friend should learn how to check your blood glucose and how to give you glucagon. Ask your health care provider if you need to have an emergency glucagon kit available. Follow these instructions at home: Medicines Take prescribed insulin or diabetes medicines as told by your health care provider. Do not run out of insulin or other diabetes medicines. Plan ahead so you always have these available. If you use insulin, adjust your dosage based on your physical activity and what foods you eat. Your health care provider will tell you how to adjust your  dosage. Take over-the-counter and prescription medicines only as told by your health care provider. Eating and drinking  What you eat and drink affects your blood glucose and your insulin dosage. Making good choices helps to control your diabetes  and prevent other health problems. A healthy meal plan includes eating lean proteins, complex carbohydrates, fresh fruits and vegetables, low-fat dairy products, and healthy fats. Make an appointment to see a registered dietitian to help you create an eating plan that is right for you. Make sure that you: Follow instructions from your health care provider about eating or drinking restrictions. Drink enough fluid to keep your urine pale yellow. Keep a record of the carbohydrates that you eat. Do this by reading food labels and learning the standard serving sizes of foods. Follow your sick-day plan whenever you cannot eat or drink as usual. Make this plan in advance with your health care provider.  Activity Stay active. Exercise regularly, as told by your health care provider. This may include: Stretching and doing strength exercises, such as yoga or weight lifting, two or more times a week. Doing 150 minutes or more of moderate-intensity or vigorous-intensity exercise each week. This could be brisk walking, biking, or water aerobics. Spread out your activity over 3 or more days of the week. Do not go more than 2 days in a row without doing some kind of physical activity. When you start a new exercise or activity, work with your health care provider to adjust your insulin, medicines, or food intake as needed. Lifestyle Do not use any products that contain nicotine or tobacco. These products include cigarettes, chewing tobacco, and vaping devices, such as e-cigarettes. If you need help quitting, ask your health care provider. If you drink alcohol and your health care provider says that it is safe for you: Limit how much you have to: 0-1 drink a day for women who are not pregnant. 0-2 drinks a day for men. Know how much alcohol is in your drink. In the [place] one drink equals one 12 oz bottle of beer (355 mL), one 5 oz glass of wine (148 mL), or one 1 oz glass of hard liquor (44 mL). Learn to manage  stress. If you need help with this, ask your health care provider. Take care of your body  Keep your immunizations up to date. In addition to getting vaccinations as told by your health care provider, it is recommended that you get vaccinated against the following illnesses: The flu (influenza). Get a flu shot every year. Pneumonia. Hepatitis B. Schedule an eye exam soon after your diagnosis, and then one time every year after that. Check your skin and feet every day for cuts, bruises, redness, blisters, or sores. Schedule a foot exam with your health care provider once every year. Brush your teeth and gums two times a day, and floss one or more times a day. Visit your dentist one or more times every 6 months. Maintain a healthy weight. General instructions Share your diabetes management plan with people in your workplace, school, and household. Carry a medical alert card or wear medical alert jewelry. Keep all follow-up visits. This is important. Questions to ask your health care provider Should I meet with a certified diabetes care and education specialist? Where can I find a support group for people with diabetes? Where to find more information For help and guidance and for more information about diabetes, please visit: American Diabetes Association (ADA): www.diabetes.org American Association of Diabetes Care and Education Specialists (  ADCES): www.diabeteseducator.org International Diabetes Federation (IDF): DCOnly.dk Summary Caring for yourself after you have been diagnosed with type 2 diabetes (type 2 diabetes mellitus) means keeping your blood sugar (glucose) under control with a balance of nutrition, exercise, lifestyle changes, and medicine. Check your blood glucose every day, as often as told by your health care provider. Having diabetes can put you at risk for other long-term (chronic) conditions, such as heart disease and kidney disease. Your health care provider may prescribe  medicines to help prevent complications from diabetes. Share your diabetes management plan with people in your workplace, school, and household. Keep all follow-up visits. This is important. This information is not intended to replace advice given to you by your health care provider. Make sure you discuss any questions you have with your health care provider. Document Revised: 08/09/2020 Document Reviewed: 08/09/2020 Elsevier Patient Education  2024 [employer].

## 2023-06-11 NOTE — Progress Notes (Signed)
 Subjective:  Patient ID: Melissa Grant, female    DOB: Dec 09, 1975  Age: 48 y.o. MRN: [id number]  CC:  Chief Complaint  Patient presents with   Blood Sugar Problem    Pt wants to discuss recent issues with blood sugar/ A1c.     HPI Melissa Grant presents for   Follow up elevated blood sugar. Here with tactile sign language interpreter.   Diabetes: History of hyperglycemia, 167 on December 16 labs without prior A1c or known history of diabetes.  New diagnosis of diabetes based on last A1c of 9.4 on February 7.  Glucose 238 at that time, up from 167 in December. No home monitor - she would have difficulty checking her own blood sugar due to visual impairment. Possible CGM may be helpful has phone that is connected to a braille reader.   She was surprised with blood sugar as she has been trying to work on exercise, diet. She feels like she could make some diet changes in addition to meds.   No soda or sweet tea. Stopped drinking soda since feb 7th. Lost 10#s since last visit. Has made some changes since oral surgery. Intentional weight loss with changes above.  Denies sx's of hyperglycemia- no change in thirst, urination.  No abd pain, n/v  Wt Readings from Last 3 Encounters:  06/11/23 187 lb 12.8 oz (85.2 kg)  05/02/23 197 lb (89.4 kg)  04/25/23 197 lb 12.8 oz (89.7 kg)       Lab Results  Component Value Date   HGBA1C 9.4 (H) 05/02/2023   Lab Results  Component Value Date   LDLCALC 84 05/02/2023   CREATININE 0.79 05/02/2023    History Patient Active Problem List   Diagnosis Date Noted   Legally blind 04/27/2023   Congenital glaucoma of both eyes    OAB (overactive bladder) 09/09/2022   History of kidney stones 09/09/2022   Nocturia 09/09/2022   CATARACT 05/22/2006   Hearing loss 05/22/2006   Mitral valve prolapse 05/22/2006   Past Medical History:  Diagnosis Date   Blind    Cataract    Congenital glaucoma of both eyes    Hearing impaired person, bilateral     Heart murmur    Past Surgical History:  Procedure Laterality Date   EYE SURGERY     EYE SURGERY     EYE SURGERY     EYE SURGERY     EYE SURGERY     EYE SURGERY     EYE SURGERY     EYE SURGERY     HERNIA REPAIR     LAPAROSCOPIC UNILATERAL SALPINGO OOPHERECTOMY Right 03/10/2023   Procedure: LAPAROSCOPIC RIGHT SALPINGECTOMY AND RIGHT OOPHORECTOMY;  Surgeon: Jerene Bears, MD;  Location: [facility] OR;  Service: Gynecology;  Laterality: Right;   Allergies  Allergen Reactions   Alphagan [Brimonidine] Itching, Swelling and Other (See Comments)    Watery eyes   Prior to Admission medications   Medication Sig Start Date End Date Taking? Authorizing Provider  Multiple Vitamin (MULTIVITAMIN) tablet Take 1 tablet by mouth daily. Patient not taking: Reported on 04/25/2023    [provider]   Social History   Socioeconomic History   Marital status: Married    Spouse name: Not on file   Number of children: Not on file   Years of education: Not on file   Highest education level: Not on file  Occupational History   Not on file  Tobacco Use   Smoking status: Former  Types: Cigarettes   Smokeless tobacco: Former   Tobacco comments:    Briefly as a teenager  Vaping Use   Vaping status: Never Used  Substance and Sexual Activity   Alcohol use: Yes    Alcohol/week: 3.0 standard drinks of alcohol    Types: 3 Glasses of wine per week    Comment: pt drinks rarely, but when she dose normaly has 3 sevings   Drug use: Yes    Types: Marijuana   Sexual activity: Yes  Other Topics Concern   Not on file  Social History Narrative   Not on file   Social Drivers of Health   Financial Resource Strain: Not on file  Food Insecurity: Food Insecurity Present (04/04/2022)   Hunger Vital Sign    Worried About Running Out of Food in the Last Year: Sometimes true    Ran Out of Food in the Last Year: Sometimes true  Transportation Needs: Not on file  Physical Activity: Not on file  Stress:  Not on file  Social Connections: Not on file  Intimate Partner Violence: Not on file    Review of Systems   Objective:   Vitals:   06/11/23 1313  BP: 122/78  Pulse: 72  SpO2: 95%  Weight: 187 lb 12.8 oz (85.2 kg)     Physical Exam Vitals reviewed.  Constitutional:      Appearance: Normal appearance. She is well-developed.  HENT:     Head: Normocephalic and atraumatic.  Neck:     Vascular: No carotid bruit.  Cardiovascular:     Rate and Rhythm: Normal rate and regular rhythm.     Heart sounds: Normal heart sounds.  Pulmonary:     Effort: Pulmonary effort is normal.     Breath sounds: Normal breath sounds.  Abdominal:     General: There is no distension.     Palpations: Abdomen is soft. There is no mass or pulsatile mass.     Tenderness: There is no abdominal tenderness.  Musculoskeletal:     Right lower leg: No edema.     Left lower leg: No edema.  Skin:    General: Skin is warm and dry.  Neurological:     Mental Status: She is alert and oriented to person, place, and time.  Psychiatric:        Mood and Affect: Mood normal.        Behavior: Behavior normal.        Assessment & Plan:  Melissa Grant is a 48 y.o. female . Type 2 diabetes mellitus with hyperglycemia, without long-term current use of insulin (HCC) - Plan: metFORMIN (GLUCOPHAGE) 500 MG tablet, Continuous Glucose Sensor (FREESTYLE LIBRE 3 SENSOR) MISC, Basic metabolic panel, Lipase, Microalbumin / creatinine urine ratio New onset.  She has made significant changes in diet.  Additional handout given on diabetes recommendations including dietary recommendations.  Based on last A1c will start initially with metformin, 500 mg daily, increase to twice daily if tolerated.  Given difficulty with traditional meter with her hearing, visual impairment, will try to see if we can get a CGM covered.  Recheck in 2 months with RTC precautions given.  Other labs as above.  Meds ordered this encounter  Medications    metFORMIN (GLUCOPHAGE) 500 MG tablet    Sig: Take 1 tablet (500 mg total) by mouth 2 (two) times daily with a meal. Start once per day once per week, then twice per day if tolerated.    Dispense:  180 tablet  Refill:  1   Continuous Glucose Sensor (FREESTYLE LIBRE 3 SENSOR) MISC    Sig: 1 Application by Does not apply route every 14 (fourteen) days. Place 1 sensor on the skin every 14 days. Use to check glucose continuously    Dispense:  6 each    Refill:  1    Hearing and visual impairment - unable to use fingerstick monitor.   Patient Instructions  Start metformin daily for 1 week and if tolerated increase to twice per day.  Diet as below and we can check labs again next visit.  I will order a glucose monitor to see if that will be covered to check your blood sugar.  Repeat labs in 2 months.   Let me know if there are questions or any new symptoms.    Type 2 Diabetes Mellitus, Self-Care, Adult Caring for yourself after you have been diagnosed with type 2 diabetes (type 2 diabetes mellitus) means keeping your blood sugar (glucose) under control with a balance of: Nutrition. Exercise. Lifestyle changes. Medicines or insulin, if needed. Support from your team of health care providers and others. What are the risks? Having type 2 diabetes can put you at risk for other long-term (chronic) conditions, such as heart disease and kidney disease. Your health care provider may prescribe medicines to help prevent complications from diabetes. How to monitor your blood glucose  Check your blood glucose every day or as often as told by your health care provider. Have your A1C (hemoglobin A1C) level checked two or more times a year, or as often as told by your health care provider. Your health care provider will set personalized treatment goals for you. Generally, the goal of treatment is to maintain the following blood glucose levels: Before meals: 80-130 mg/dL (1.6-1.0 mmol/L). After  meals: below 180 mg/dL (10 mmol/L). A1C level: less than 7%. How to manage hyperglycemia and hypoglycemia Hyperglycemia symptoms Hyperglycemia, also called high blood glucose, occurs when blood glucose is too high. Make sure you know the early signs of hyperglycemia, such as: Increased thirst. Hunger. Feeling very tired. Needing to urinate more often than usual. Blurry vision. Hypoglycemia symptoms Hypoglycemia, also called low blood glucose, occurs with a blood glucose level at or below 70 mg/dL (3.9 mmol/L). Diabetes medicines lower your blood glucose and can cause hypoglycemia. The risk for hypoglycemia increases during or after exercise, during sleep, during illness, and when skipping meals or not eating for a long time (fasting). It is important to know the symptoms of hypoglycemia and treat it right away. Always have a 15-gram rapid-acting carbohydrate snack with you to treat low blood glucose. Family members and close friends should also know the symptoms and understand how to treat hypoglycemia, in case you are not able to treat yourself. Symptoms may include: Hunger. Anxiety. Sweating and feeling clammy. Dizziness or feeling light-headed. Sleepiness. Increased heart rate. Irritability. Tingling or numbness around the mouth, lips, or tongue. Restless sleep. Severe hypoglycemia is when your blood glucose level is at or below 54 mg/dL (3 mmol/L). Severe hypoglycemia is an emergency. Do not wait to see if the symptoms will go away. Get medical help right away. Call your local emergency services (911 in the [place] Do not drive yourself to the hospital. If you have severe hypoglycemia and you cannot eat or drink, you may need glucagon. A family member or close friend should learn how to check your blood glucose and how to give you glucagon. Ask your health care provider if you  need to have an emergency glucagon kit available. Follow these instructions at home: Medicines Take prescribed  insulin or diabetes medicines as told by your health care provider. Do not run out of insulin or other diabetes medicines. Plan ahead so you always have these available. If you use insulin, adjust your dosage based on your physical activity and what foods you eat. Your health care provider will tell you how to adjust your dosage. Take over-the-counter and prescription medicines only as told by your health care provider. Eating and drinking  What you eat and drink affects your blood glucose and your insulin dosage. Making good choices helps to control your diabetes and prevent other health problems. A healthy meal plan includes eating lean proteins, complex carbohydrates, fresh fruits and vegetables, low-fat dairy products, and healthy fats. Make an appointment to see a registered dietitian to help you create an eating plan that is right for you. Make sure that you: Follow instructions from your health care provider about eating or drinking restrictions. Drink enough fluid to keep your urine pale yellow. Keep a record of the carbohydrates that you eat. Do this by reading food labels and learning the standard serving sizes of foods. Follow your sick-day plan whenever you cannot eat or drink as usual. Make this plan in advance with your health care provider.  Activity Stay active. Exercise regularly, as told by your health care provider. This may include: Stretching and doing strength exercises, such as yoga or weight lifting, two or more times a week. Doing 150 minutes or more of moderate-intensity or vigorous-intensity exercise each week. This could be brisk walking, biking, or water aerobics. Spread out your activity over 3 or more days of the week. Do not go more than 2 days in a row without doing some kind of physical activity. When you start a new exercise or activity, work with your health care provider to adjust your insulin, medicines, or food intake as needed. Lifestyle Do not use any  products that contain nicotine or tobacco. These products include cigarettes, chewing tobacco, and vaping devices, such as e-cigarettes. If you need help quitting, ask your health care provider. If you drink alcohol and your health care provider says that it is safe for you: Limit how much you have to: 0-1 drink a day for women who are not pregnant. 0-2 drinks a day for men. Know how much alcohol is in your drink. In the [place] one drink equals one 12 oz bottle of beer (355 mL), one 5 oz glass of wine (148 mL), or one 1 oz glass of hard liquor (44 mL). Learn to manage stress. If you need help with this, ask your health care provider. Take care of your body  Keep your immunizations up to date. In addition to getting vaccinations as told by your health care provider, it is recommended that you get vaccinated against the following illnesses: The flu (influenza). Get a flu shot every year. Pneumonia. Hepatitis B. Schedule an eye exam soon after your diagnosis, and then one time every year after that. Check your skin and feet every day for cuts, bruises, redness, blisters, or sores. Schedule a foot exam with your health care provider once every year. Brush your teeth and gums two times a day, and floss one or more times a day. Visit your dentist one or more times every 6 months. Maintain a healthy weight. General instructions Share your diabetes management plan with people in your workplace, school, and household. Carry a medical  alert card or wear medical alert jewelry. Keep all follow-up visits. This is important. Questions to ask your health care provider Should I meet with a certified diabetes care and education specialist? Where can I find a support group for people with diabetes? Where to find more information For help and guidance and for more information about diabetes, please visit: American Diabetes Association (ADA): www.diabetes.org American Association of Diabetes Care and  Education Specialists (ADCES): www.diabeteseducator.org International Diabetes Federation (IDF): DCOnly.dk Summary Caring for yourself after you have been diagnosed with type 2 diabetes (type 2 diabetes mellitus) means keeping your blood sugar (glucose) under control with a balance of nutrition, exercise, lifestyle changes, and medicine. Check your blood glucose every day, as often as told by your health care provider. Having diabetes can put you at risk for other long-term (chronic) conditions, such as heart disease and kidney disease. Your health care provider may prescribe medicines to help prevent complications from diabetes. Share your diabetes management plan with people in your workplace, school, and household. Keep all follow-up visits. This is important. This information is not intended to replace advice given to you by your health care provider. Make sure you discuss any questions you have with your health care provider. Document Revised: 08/09/2020 Document Reviewed: 08/09/2020 Elsevier Patient Education  2024 [employer]      Signed,   Meredith Staggers, MD [facility], [facility] 06/11/23 1:27 PM

## 2023-06-12 ENCOUNTER — Ambulatory Visit: Payer: Medicare Other | Admitting: Family Medicine

## 2023-06-12 LAB — BASIC METABOLIC PANEL
BUN: 12 mg/dL (ref 6–23)
CO2: 26 meq/L (ref 19–32)
Calcium: 9.7 mg/dL (ref 8.4–10.5)
Chloride: 100 meq/L (ref 96–112)
Creatinine, Ser: 0.83 mg/dL (ref 0.40–1.20)
GFR: 83.55 mL/min (ref >60.00)
Glucose, Bld: 254 mg/dL — ABNORMAL HIGH (ref 70–99)
Potassium: 4.3 meq/L (ref 3.5–5.1)
Sodium: 135 meq/L (ref 135–145)

## 2023-06-12 LAB — LIPASE: Lipase: 22 U/L (ref 11.0–59.0)

## 2023-06-14 ENCOUNTER — Encounter: Payer: Self-pay | Admitting: Family Medicine

## 2023-06-15 ENCOUNTER — Encounter: Payer: Self-pay | Admitting: Family Medicine

## 2023-06-16 NOTE — Telephone Encounter (Signed)
 Patient has questions about labs considering the changes she has made to her lifestyle as well

## 2023-06-27 ENCOUNTER — Institutional Professional Consult (permissible substitution): Admitting: Neurology

## 2023-07-25 ENCOUNTER — Ambulatory Visit (INDEPENDENT_AMBULATORY_CARE_PROVIDER_SITE_OTHER): Admitting: Neurology

## 2023-07-25 ENCOUNTER — Encounter: Payer: Self-pay | Admitting: Neurology

## 2023-07-25 VITALS — BP 106/63 | HR 49 | Wt 177.0 lb

## 2023-07-25 DIAGNOSIS — Z9189 Other specified personal risk factors, not elsewhere classified: Secondary | ICD-10-CM | POA: Diagnosis not present

## 2023-07-25 DIAGNOSIS — R351 Nocturia: Secondary | ICD-10-CM | POA: Diagnosis not present

## 2023-07-25 DIAGNOSIS — G4719 Other hypersomnia: Secondary | ICD-10-CM | POA: Diagnosis not present

## 2023-07-25 DIAGNOSIS — R519 Headache, unspecified: Secondary | ICD-10-CM

## 2023-07-25 DIAGNOSIS — R0683 Snoring: Secondary | ICD-10-CM

## 2023-07-25 DIAGNOSIS — E66811 Obesity, class 1: Secondary | ICD-10-CM | POA: Diagnosis not present

## 2023-07-25 DIAGNOSIS — R0689 Other abnormalities of breathing: Secondary | ICD-10-CM

## 2023-07-25 NOTE — Patient Instructions (Signed)

## 2023-07-25 NOTE — Progress Notes (Signed)
 Subjective:    Patient ID: Melissa Grant is a 48 y.o. female.  HPI    Melissa Fairy, MD, PhD [facility] [address] [address] [place], [place] [address]  Dear Dr. Ester Helms,  I saw your patient, Melissa Grant, upon your kind request in my sleep clinic today for initial consultation of her sleep disorder, in particular, concern for underlying obstructive sleep apnea.  The patient is accompanied by a sign language interpreter, Melissa Grant, today.  As you know, 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  Her nocturia has actually improved after she had an ovarian cyst removed in December 2024.  She is working on weight loss and she was diagnosed with diabetes.  She has lost about 21 pounds in the past 12 weeks.  She has nocturia about once per night.  Epworth sleepiness score is about 9 out of 24, fatigue severity score is 33 out of 63.  She lives with her husband.  She has 2 children and mom currently mostly staying with their father.  She is a non-smoker and does not utilize alcohol, she drinks caffeine in the form of coffee, about 3 cups/day.  She does not have a set bedtime and rise time, typically in bed somewhere between 11 PM and 1 AM and rise time between 3 AM and 8 AM.  She has had very occasional morning headaches.  I reviewed the office note from 06/11/2023.  She was advised to seek sleep apnea evaluation after she saw urology.  Her Past Medical History Is Significant For: Past Medical History:  Diagnosis Date   Blind    Cataract    Congenital glaucoma of both eyes    Hearing impaired person, bilateral    Heart murmur      Her Past Surgical History Is Significant For: Past Surgical History:  Procedure Laterality Date   EYE SURGERY     EYE SURGERY     EYE SURGERY     EYE SURGERY     EYE SURGERY     EYE SURGERY     EYE SURGERY     EYE SURGERY     HERNIA REPAIR     LAPAROSCOPIC UNILATERAL SALPINGO OOPHERECTOMY Right 03/10/2023   Procedure: LAPAROSCOPIC RIGHT SALPINGECTOMY AND RIGHT OOPHORECTOMY;  Surgeon: Lillian Rein, MD;  Location: [facility];  Service: Gynecology;  Laterality: Right;    Her Family History Is Significant For: Family History  Problem Relation Age of Onset   Arthritis Mother    Hyperlipidemia Father    Heart disease Father    Hypertension Father    Atrial fibrillation Father    Arthritis Father    Alzheimer's disease Father    Diabetes Sister    Diabetes Maternal Grandmother     Her Social History Is Significant For: Social History   Socioeconomic History   Marital status: Married    Spouse name: Not on file   Number of children: 2   Years of education: Not on file   Highest education level: Not on file  Occupational History   Not on file  Tobacco Use   Smoking status: Former    Types: Cigarettes  Smokeless tobacco: Former   Tobacco comments:    Briefly as a teenager  Vaping Use   Vaping status: Never Used  Substance and Sexual Activity   Alcohol use: Yes    Alcohol/week: 3.0 standard drinks of alcohol    Types: 3 Glasses of wine per week    Comment: pt drinks rarely, but when she dose normaly has 3 sevings   Drug use: Yes    Types: Marijuana   Sexual activity: Yes  Other Topics Concern   Not on file  Social History Narrative   Right handed   Caffeine use: Coffee every day, soda sometimes   Lives with husband   Social Drivers of Corporate investment banker Strain: Not on file  Food Insecurity: Food Insecurity Present (04/04/2022)   Hunger Vital Sign    Worried About Running Out of Food in the Last Year: Sometimes true    Ran Out of Food in the Last  Year: Sometimes true  Transportation Needs: Not on file  Physical Activity: Not on file  Stress: Not on file  Social Connections: Not on file    Her Allergies Are:  Allergies  Allergen Reactions   Alphagan [Brimonidine] Itching, Swelling and Other (See Comments)    Watery eyes  :   Her Current Medications Are:  Outpatient Encounter Medications as of 07/25/2023  Medication Sig   UNABLE TO FIND Take 2 Doses by mouth daily. Med Name: Cinnamon gummy   Continuous Glucose Sensor (FREESTYLE LIBRE 3 SENSOR) MISC 1 Application by Does not apply route every 14 (fourteen) days. Place 1 sensor on the skin every 14 days. Use to check glucose continuously (Patient not taking: Reported on 07/25/2023)   metFORMIN  (GLUCOPHAGE ) 500 MG tablet Take 1 tablet (500 mg total) by mouth 2 (two) times daily with a meal. Start once per day once per week, then twice per day if tolerated. (Patient not taking: Reported on 07/25/2023)   Multiple Vitamin (MULTIVITAMIN) tablet Take 1 tablet by mouth daily. (Patient not taking: Reported on 04/25/2023)   No facility-administered encounter medications on file as of 07/25/2023.  :   Review of Systems:  Out of a complete 14 point review of systems, all are reviewed and negative with the exception of these symptoms as listed below:   Review of Systems  Neurological:        RM 9. Pt Deaf, here with interpreter/Melissa Grant. ESS: 9 and FSS: 33    Objective:  Neurological Exam  Physical Exam Physical Examination:   Vitals:   07/25/23 1025  BP: 106/63  Pulse: (!) 49    General Examination: The patient is a very pleasant 48 y.o. female in no acute distress. She appears well-developed and well-nourished and well groomed.   HEENT: Normocephalic, vision impaired, hearing impaired.  Face is symmetric.  Airway examination reveals a small airway, Mallampati class IV, uvula tip and tonsils not fully visualized.  Neck circumference 14 three-quarter inches.  Minimal overbite noted.     Chest: Clear to auscultation without wheezing, rhonchi or crackles noted.  Heart: S1+S2+0, regular with mild diastolic heart murmur noted.    Abdomen: Soft, non-tender and non-distended.  Extremities: There is no pitting edema in the distal lower extremities bilaterally.   Skin: Warm and dry without trophic changes noted.   Musculoskeletal: exam reveals no obvious joint deformities.   Neurologically:  Mental status: The patient is awake, alert.  Cranial nerves II - XII are as described above under HEENT exam.  Motor exam:  Normal bulk, strength and tone is noted. There is no obvious action or resting tremor.  Fine motor skills and coordination: grossly intact.  Cerebellar testing: No dysmetria or intention tremor. There is no truncal or gait ataxia.  Sensory exam: intact to light touch in the upper and lower extremities.  Gait, station and balance: She stands without difficulty.   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 While a laboratory attended sleep study is typically considered "gold standard" for evaluation of sleep disordered breathing, we mutually agreed to proceed with a home sleep test at this time.   I had a long chat with the patient about my findings and the diagnosis of sleep apnea, particularly OSA, its prognosis and treatment options. We talked about medical/conservative treatments, surgical interventions and non-pharmacological approaches for symptom control. I explained, in particular, the risks and ramifications of untreated moderate to severe OSA, especially with  respect to developing cardiovascular disease down the road, including congestive heart failure (CHF), difficult to treat hypertension, cardiac arrhythmias (particularly A-fib), neurovascular complications including TIA, stroke and dementia. Even type 2 diabetes has, in part, been linked to untreated OSA. Symptoms of untreated OSA may include (but may not be limited to) daytime sleepiness, nocturia (i.e. frequent nighttime urination), memory problems, mood irritability and suboptimally controlled or worsening mood disorder such as depression and/or anxiety, lack of energy, lack of motivation, physical discomfort, as well as recurrent headaches, especially morning or nocturnal headaches. We talked about the importance of maintaining a healthy lifestyle and striving for healthy weight.  I recommended a sleep study at this time. I outlined the differences between a laboratory attended sleep study which is considered more comprehensive and accurate over the option of a home sleep test (HST); the latter may lead to underestimation of sleep disordered breathing in some instances and does not help with diagnosing upper airway resistance syndrome and is not accurate enough to diagnose primary central sleep apnea typically. I outlined possible surgical and non-surgical treatment options of OSA, including the use of a positive airway pressure (PAP) device (i.e. CPAP, AutoPAP/APAP or BiPAP in certain circumstances), a custom-made dental device (aka oral appliance, which would require a referral to a specialist dentist or orthodontist typically, and is generally speaking not considered for patients with full dentures or edentulous state), upper airway surgical options, such as traditional UPPP (which is not considered a first-line treatment) or the Inspire device (hypoglossal nerve stimulator, which would involve a referral for consultation with an ENT surgeon, after careful selection, following inclusion criteria - also not  first-line treatment). I explained the PAP treatment option to the patient in detail, as this is generally considered first-line treatment.  The patient indicated that she would be willing to try PAP therapy, if the need arises. I explained the importance of being compliant with PAP treatment, not only for insurance purposes but primarily to improve patient's symptoms symptoms, and for the patient's long term health benefit, including to reduce Her cardiovascular risks longer-term.  I was able to demonstrate an oral appliance model to her and an AutoPap machine model.   We will pick up our discussion about the next steps and treatment  options after testing.  We will keep her posted as to the test results by phone call and/or MyChart messaging where possible.  We will plan to follow-up in sleep clinic accordingly as well.  I answered all her questions today and the patient was in agreement.   I encouraged her to call with any interim questions, concerns, problems or updates or email us  through MyChart.  Generally speaking, sleep test authorizations may take up to 2 weeks, sometimes less, sometimes longer, the patient is encouraged to get in touch with us  if they do not hear back from the sleep lab staff directly within the next 2 weeks.  Thank you very much for allowing me to participate in the care of this nice patient. If I can be of any further assistance to you please do not hesitate to call me at [phone].  Sincerely,   Melissa Fairy, MD, PhD

## 2023-08-11 ENCOUNTER — Ambulatory Visit (INDEPENDENT_AMBULATORY_CARE_PROVIDER_SITE_OTHER): Admitting: Family Medicine

## 2023-08-11 ENCOUNTER — Encounter: Payer: Self-pay | Admitting: Family Medicine

## 2023-08-11 VITALS — BP 124/70 | HR 75 | Temp 97.8°F | Resp 15 | Ht 64.0 in | Wt 175.6 lb

## 2023-08-11 DIAGNOSIS — E1165 Type 2 diabetes mellitus with hyperglycemia: Secondary | ICD-10-CM | POA: Diagnosis not present

## 2023-08-11 DIAGNOSIS — Z23 Encounter for immunization: Secondary | ICD-10-CM | POA: Diagnosis not present

## 2023-08-11 MED ORDER — FREESTYLE LIBRE 3 SENSOR MISC: 1.0000 | 1 refills | Status: DC

## 2023-08-11 NOTE — Patient Instructions (Addendum)
 Thanks for coming in today and great work on the weight loss and better eating.  I expect your readings to look better today, but will let you know if medicine is needed.   Take care.   Type 2 Diabetes Mellitus, Diagnosis, Adult Type 2 diabetes (type 2 diabetes mellitus) is a long-term, or chronic, disease. In type 2 diabetes, one or both of these problems may be present: The pancreas does not make enough of a hormone called insulin. Cells in the body do not respond properly to the insulin that the body makes (insulin resistance). Normally, insulin allows blood sugar (glucose) to enter cells in the body. The cells use glucose for energy. Insulin resistance or lack of insulin causes excess glucose to build up in the blood instead of going into cells. This causes high blood glucose (hyperglycemia).  What are the causes? The exact cause of type 2 diabetes is not known. What increases the risk? The following factors may make you more likely to develop this condition: Having a family member with type 2 diabetes. Being overweight or obese. Being inactive (sedentary). Having been diagnosed with insulin resistance. Having a history of prediabetes, diabetes when you were pregnant (gestational diabetes), or polycystic ovary syndrome (PCOS). What are the signs or symptoms? In the early stage of this condition, you may not have symptoms. Symptoms develop slowly and may include: Increased thirst or hunger. Increased urination. Unexplained weight loss. Tiredness (fatigue) or weakness. Vision changes, such as blurry vision. Dark patches on the skin. How is this diagnosed? This condition is diagnosed based on your symptoms, your medical history, a physical exam, and your blood glucose level. Your blood glucose may be checked with one or more of the following blood tests: A fasting blood glucose (FBG) test. You will not be allowed to eat (you will fast) for 8 hours or longer before a blood sample is  taken. A random blood glucose test. This test checks blood glucose at any time of day regardless of when you ate. An A1C (hemoglobin A1C) blood test. This test provides information about blood glucose levels over the previous 2-3 months. An oral glucose tolerance test (OGTT). This test measures your blood glucose at two times: After fasting. This is your baseline blood glucose level. Two hours after drinking a beverage that contains glucose. You may be diagnosed with type 2 diabetes if: Your fasting blood glucose level is 126 mg/dL (7.0 mmol/L) or higher. Your random blood glucose level is 200 mg/dL (16.1 mmol/L) or higher. Your A1C level is 6.5% or higher. Your oral glucose tolerance test result is higher than 200 mg/dL (09.6 mmol/L). These blood tests may be repeated to confirm your diagnosis. How is this treated? Your treatment may be managed by a specialist called an endocrinologist. Type 2 diabetes may be treated by following instructions from your health care provider about: Making dietary and lifestyle changes. These may include: Following a personalized nutrition plan that is developed by a registered dietitian. Exercising regularly. Finding ways to manage stress. Checking your blood glucose level as often as told. Taking diabetes medicines or insulin daily. This helps to keep your blood glucose levels in the healthy range. Taking medicines to help prevent complications from diabetes. Medicines may include: Aspirin. Medicine to lower cholesterol. Medicine to control blood pressure. Your health care provider will set treatment goals for you. Your goals will be based on your age, other medical conditions you have, and how you respond to diabetes treatment. Generally, the goal of  treatment is to maintain the following blood glucose levels: Before meals: 80-130 mg/dL (4.4-7.2 mmol/L). After meals: below 180 mg/dL (10 mmol/L). A1C level: less than 7%. Follow these instructions at  home: Questions to ask your health care provider Consider asking the following questions: Should I meet with a certified diabetes care and education specialist? What diabetes medicines do I need, and when should I take them? What equipment will I need to manage my diabetes at home? How often do I need to check my blood glucose? Where can I find a support group for people with diabetes? What number can I call if I have questions? When is my next appointment? General instructions Take over-the-counter and prescription medicines only as told by your health care provider. Keep all follow-up visits. This is important. Where to find more information For help and guidance and for more information about diabetes, please visit: American Diabetes Association (ADA): www.diabetes.org American Association of Diabetes Care and Education Specialists (ADCES): www.diabeteseducator.org International Diabetes Federation (IDF): DCOnly.dk Contact a health care provider if: Your blood glucose is at or above 240 mg/dL (78.2 mmol/L) for 2 days in a row. You have been sick or have had a fever for 2 days or longer, and you are not getting better. You have any of the following problems for more than 6 hours: You cannot eat or drink. You have nausea and vomiting. You have diarrhea. Get help right away if: You have severe hypoglycemia. This means your blood glucose is lower than 54 mg/dL (3.0 mmol/L). You become confused or you have trouble thinking clearly. You have difficulty breathing. You have moderate or large ketone levels in your urine. These symptoms may represent a serious problem that is an emergency. Do not wait to see if the symptoms will go away. Get medical help right away. Call your local emergency services (911 in the [place] Do not drive yourself to the hospital. Summary Type 2 diabetes mellitus is a long-term, or chronic, disease. In type 2 diabetes, the pancreas does not make enough of a  hormone called insulin, or cells in the body do not respond properly to insulin that the body makes. This condition is treated by making dietary and lifestyle changes and taking diabetes medicines or insulin. Your health care provider will set treatment goals for you. Your goals will be based on your age, other medical conditions you have, and how you respond to diabetes treatment. Keep all follow-up visits. This is important. This information is not intended to replace advice given to you by your health care provider. Make sure you discuss any questions you have with your health care provider. Document Revised: 06/05/2020 Document Reviewed: 06/05/2020 Elsevier Patient Education  2024 [employer]  Metformin  Tablets What is this medication? METFORMIN  (met FOR min) treats type 2 diabetes. It controls blood sugar (glucose) and helps your body use insulin effectively. Changes to diet and exercise are often combined with this medication. This medicine may be used for other purposes; ask your health care provider or pharmacist if you have questions. COMMON BRAND NAME(S): Glucophage  What should I tell my care team before I take this medication? They need to know if you have any of these conditions: Dehydration Frequently drink alcohol Have had diabetic ketoacidosis (DKA) Having a CT or X-ray scan Having surgery Heart attack Heart disease Infection Irregular menstrual cycles Kidney disease Liver disease Low levels of calcium in your blood Low levels of vitamin B in your blood Stroke An unusual or allergic reaction to  metformin , other medications, foods, dyes, or preservatives Pregnant or trying to get pregnant Breastfeeding How should I use this medication? Take this medication by mouth with water. Take it as directed on the prescription label at the same time every day. Take it with food at the start of a meal. Keep taking it unless your care team tells you to stop. Talk to your care  team about the use of this medication in children. While it may be prescribed for children as young as 10 years for selected conditions, precautions do apply. Overdosage: If you think you have taken too much of this medicine contact a poison control center or emergency room at once. NOTE: This medicine is only for you. Do not share this medicine with others. What if I miss a dose? If you miss a dose, take it as soon as you can. If it is almost time for your next dose, take only that dose. Do not take double or extra doses. What may interact with this medication? Do not take this medication with any of the following: Certain contrast agents used before, CT, MRI, or X-ray scans This medication may also interact with the following: Acetazolamide Alcohol Cimetidine Dichlorphenamide Dolutegravir Ranolazine Topiramate Vandetanib Zonisamide Some medications may affect your blood sugar levels or hide the symptoms of low blood sugar (hypoglycemia). Talk with your care team about all the medications you take. They may suggest checking your blood sugar levels more often. Medications that may affect your blood sugar levels include: Alcohol Certain antibiotics, such as ciprofloxacin, levofloxacin, sulfamethoxazole; trimethoprim Certain medications for blood pressure or heart disease, such as benazepril, enalapril, lisinopril, losartan, valsartan Certain medications for mental health conditions, such as fluoxetine or olanzapine Diuretics, such as hydrochlorothiazide (HCTZ) Estrogen and progestin hormones Other medications for diabetes Steroid medications, such as prednisone or cortisone Testosterone Thyroid hormones Medications that may mask symptoms of low blood sugar include: Beta-blockers, such as atenolol, metoprolol, propranolol Clonidine Guanethidine Reserpine This list may not describe all possible interactions. Give your health care provider a list of all the medicines, herbs,  non-prescription drugs, or dietary supplements you use. Also tell them if you smoke, drink alcohol, or use illegal drugs. Some items may interact with your medicine. What should I watch for while using this medication? Visit your care team for regular checks on your progress. Tell your care team if your symptoms do not start to get better or if they get worse. You may need blood work done while you are taking this medication. Your care team will monitor your HbA1C (A1C). This test shows what your average blood sugar (glucose) level was over the past 2 to 3 months. Know the symptoms of low blood sugar and know how to treat it. Always carry a source of quick sugar with you. Examples include hard sugar candy or glucose tablets. Make sure others know that you can choke if you eat or drink if your blood sugar is too low and you are unable to care for yourself. Get medical help at once. Tell your care team if you have high blood sugar. Your medication dose may change if your body is under stress. Some types of stress that may affect your blood sugar include fever, infection, and surgery. Wear a medical ID bracelet or chain. Carry a card that describes your condition. List the medications and doses you take on the card. Make sure you stay hydrated while taking this medication. Drink water often. Eat fruits and veggies that have a high  water content. Drink more water when it is hot or you are active. Talk to your care team right away if you have fever, infection, vomiting, diarrhea, or if you sweat a lot while taking this medication. The loss of too much body fluid may make it dangerous for you to take this medication. Tell your care team you are taking this medication before you have surgery or an imaging scan, such as a CT or X-ray. You may need to stop taking this medication for a while before and after the procedure. Your care team will tell you when to stop and when to start taking it again. Make sure you get  enough vitamin B12 while you are taking this medication. Discuss the foods you eat and the vitamins you take with your care team. This medication may cause you to ovulate, which may increase your chances of becoming pregnant. Talk with your care team about contraception while you are taking this medication. Contact your care team if you think you might be pregnant. What side effects may I notice from receiving this medication? Side effects that you should report to your care team as soon as possible: Allergic reactions--skin rash, itching, hives, swelling of the face, lips, tongue, or throat High lactic acid level--muscle pain or cramps, stomach pain, trouble breathing, general discomfort and fatigue Low vitamin B12 level--pain, tingling, or numbness in the hands or feet, muscle weakness, dizziness, confusion, trouble concentrating Side effects that usually do not require medical attention (report these to your care team if they continue or are bothersome): Diarrhea Gas Headache Metallic taste in mouth Nausea This list may not describe all possible side effects. Call your doctor for medical advice about side effects. You may report side effects to FDA at 1-800-FDA-1088. Where should I keep my medication? Keep out of the reach of children and pets. Store at room temperature between 20 and 25 degrees C (68 and 77 degrees F). Get rid of any unused medication after the expiration date. To get rid of medications that are no longer needed or have expired: Take the medication to a medication take-back program. Check with your pharmacy or law enforcement to find a location. If you cannot return the medication, check the label or package insert to see if the medication should be thrown out in the garbage or flushed down the toilet. If you are not sure, ask your care team. If it is safe to put in the trash, empty the medication out of the container. Mix the medication with cat litter, dirt, coffee grounds,  or other unwanted substance. Seal the mixture in a bag or container. Put it in the trash. NOTE: This sheet is a summary. It may not cover all possible information. If you have questions about this medicine, talk to your doctor, pharmacist, or health care provider.  2024 Elsevier/Gold Standard (2023-02-21 00:00:00)

## 2023-08-11 NOTE — Progress Notes (Signed)
 Subjective:  Patient ID: Melissa Grant, female    DOB: [birth year]  Age: 48 y.o. MRN: [id number]  CC:  Chief Complaint  Patient presents with   Medical Management of Chronic Issues    Pt notes no questions at this time.  Pt would like to discuss having Tdap and Pneumonia     HPI Melissa Grant presents for follow-up, and presents with tactile signs interpreter.  Diabetes: Newly diagnosed diabetes with A1c of 9.4 in February 7, glucose 338 at that time up from prior 167 in December of last year.  CGM was ordered for monitoring due to difficulty with her visual impairment for standard glucose monitor.  She was working on her diet and exercise when discussed in March.  She had cut back on soda and sweet tea, and was losing weight.  Has continued to do well with weight down to 175 today, 187 in March. Cut out white bread and feeling a lot better.  She was prescribed metformin . She had lost weight and heard about possible risks from friends.  Plan for labs today and then will decide if she wants to start meds.  Home readings - none - did not fill. No change in thirst or urination.  Microalbumin: Normal ratio 06/11/2023 Optho, foot exam, pneumovax: Prevnar 20 recommended, declined.  She does agree to Tdap today.    Immunization History  Administered Date(s) Administered   Td 08/24/1998    Lab Results  Component Value Date   HGBA1C 9.4 (H) 05/02/2023   Lab Results  Component Value Date   MICROALBUR 1.7 06/11/2023   LDLCALC 84 05/02/2023   CREATININE 0.83 06/11/2023    History Patient Active Problem List   Diagnosis Date Noted   Legally blind 04/27/2023   Congenital glaucoma of both eyes    OAB (overactive bladder) 09/09/2022   History of kidney stones 09/09/2022   Nocturia 09/09/2022   CATARACT 05/22/2006   Hearing loss 05/22/2006   Mitral valve prolapse 05/22/2006   Past Medical History:  Diagnosis Date   Blind    Cataract    Congenital glaucoma of both eyes     Hearing impaired person, bilateral    Heart murmur    Past Surgical History:  Procedure Laterality Date   EYE SURGERY     EYE SURGERY     EYE SURGERY     EYE SURGERY     EYE SURGERY     EYE SURGERY     EYE SURGERY     EYE SURGERY     HERNIA REPAIR     LAPAROSCOPIC UNILATERAL SALPINGO OOPHERECTOMY Right 03/10/2023   Procedure: LAPAROSCOPIC RIGHT SALPINGECTOMY AND RIGHT OOPHORECTOMY;  Surgeon: Lillian Rein, MD;  Location: [facility] OR;  Service: Gynecology;  Laterality: Right;   Allergies  Allergen Reactions   Alphagan [Brimonidine] Itching, Swelling and Other (See Comments)    Watery eyes   Prior to Admission medications   Medication Sig Start Date End Date Taking? Authorizing Provider  metFORMIN  (GLUCOPHAGE ) 500 MG tablet Take 1 tablet (500 mg total) by mouth 2 (two) times daily with a meal. Start once per day once per week, then twice per day if tolerated. 06/11/23  Yes Benjiman Bras, MD  UNABLE TO FIND Take 2 Doses by mouth daily. Med Name: Cinnamon gummy   Yes [provider]  Continuous Glucose Sensor (FREESTYLE LIBRE 3 SENSOR) MISC 1 Application by Does not apply route every 14 (fourteen) days. Place 1 sensor on the skin every 14  days. Use to check glucose continuously Patient not taking: Reported on 08/11/2023 06/11/23   Benjiman Bras, MD  Multiple Vitamin (MULTIVITAMIN) tablet Take 1 tablet by mouth daily. Patient not taking: Reported on 04/25/2023    [provider]   Social History   Socioeconomic History   Marital status: Married    Spouse name: Not on file   Number of children: 2   Years of education: Not on file   Highest education level: Not on file  Occupational History   Not on file  Tobacco Use   Smoking status: Former    Types: Cigarettes   Smokeless tobacco: Former   Tobacco comments:    Briefly as a teenager  Advertising account planner   Vaping status: Never Used  Substance and Sexual Activity   Alcohol use: Yes    Alcohol/week: 3.0 standard  drinks of alcohol    Types: 3 Glasses of wine per week    Comment: pt drinks rarely, but when she dose normaly has 3 sevings   Drug use: Yes    Types: Marijuana   Sexual activity: Yes  Other Topics Concern   Not on file  Social History Narrative   Right handed   Caffeine use: Coffee every day, soda sometimes   Lives with husband   Social Drivers of Corporate investment banker Strain: Not on file  Food Insecurity: Food Insecurity Present (04/04/2022)   Hunger Vital Sign    Worried About Running Out of Food in the Last Year: Sometimes true    Ran Out of Food in the Last Year: Sometimes true  Transportation Needs: Not on file  Physical Activity: Not on file  Stress: Not on file  Social Connections: Not on file  Intimate Partner Violence: Not on file    Review of Systems Per HPI  Objective:   Vitals:   08/11/23 1313  BP: 124/70  Pulse: 75  Resp: 15  Temp: 97.8 F (36.6 C)  TempSrc: Temporal  SpO2: 95%  Weight: 175 lb 9.6 oz (79.7 kg)  Height: 5\' 4"  (1.626 m)     Physical Exam Vitals reviewed.  Constitutional:      Appearance: Normal appearance. She is well-developed.  HENT:     Head: Normocephalic and atraumatic.  Eyes:     Conjunctiva/sclera: Conjunctivae normal.     Pupils: Pupils are equal, round, and reactive to light.  Neck:     Vascular: No carotid bruit.  Cardiovascular:     Rate and Rhythm: Normal rate and regular rhythm.     Heart sounds: Normal heart sounds.  Pulmonary:     Effort: Pulmonary effort is normal.     Breath sounds: Normal breath sounds.  Abdominal:     Palpations: Abdomen is soft. There is no pulsatile mass.     Tenderness: There is no abdominal tenderness.  Musculoskeletal:     Right lower leg: No edema.     Left lower leg: No edema.  Skin:    General: Skin is warm and dry.  Neurological:     Mental Status: She is alert and oriented to person, place, and time.  Psychiatric:        Mood and Affect: Mood normal.         Behavior: Behavior normal.        Assessment & Plan:  Melissa Grant is a 48 y.o. female . Type 2 diabetes mellitus with hyperglycemia, without long-term current use of insulin (HCC) - Plan: Continuous Glucose Sensor (  FREESTYLE LIBRE 3 SENSOR) MISC, Comprehensive metabolic panel with GFR, Hemoglobin A1c  Need for diphtheria-tetanus-pertussis (Tdap) vaccine - Plan: Tdap vaccine greater than or equal to 7yo IM  Need for pneumococcal vaccination  Repeat order placed for CGM is not currently monitoring blood sugar.  Commended on her weight loss, dietary changes, and I do think this will help glycemic control but she has not started metformin .  Based on last A1c I suspect she may still need some medication in addition to diet/exercise approach.  Discussed her concerns with metformin  from what she heard from others, discussed benefits versus risk and handout given on management of diabetes as well as information on metformin .  Check labs and adjust plan accordingly.  Tdap updated, declined pneumonia vaccine for now.  Meds ordered this encounter  Medications   Continuous Glucose Sensor (FREESTYLE LIBRE 3 SENSOR) MISC    Sig: 1 Application by Does not apply route every 14 (fourteen) days. Place 1 sensor on the skin every 14 days. Use to check glucose continuously    Dispense:  6 each    Refill:  1    Hearing and visual impairment - unable to use fingerstick monitor.   Patient Instructions  Thanks for coming in today and great work on the weight loss and better eating.  I expect your readings to look better today, but will let you know if medicine is needed.   Take care.   Type 2 Diabetes Mellitus, Diagnosis, Adult Type 2 diabetes (type 2 diabetes mellitus) is a long-term, or chronic, disease. In type 2 diabetes, one or both of these problems may be present: The pancreas does not make enough of a hormone called insulin. Cells in the body do not respond properly to the insulin that the body  makes (insulin resistance). Normally, insulin allows blood sugar (glucose) to enter cells in the body. The cells use glucose for energy. Insulin resistance or lack of insulin causes excess glucose to build up in the blood instead of going into cells. This causes high blood glucose (hyperglycemia).  What are the causes? The exact cause of type 2 diabetes is not known. What increases the risk? The following factors may make you more likely to develop this condition: Having a family member with type 2 diabetes. Being overweight or obese. Being inactive (sedentary). Having been diagnosed with insulin resistance. Having a history of prediabetes, diabetes when you were pregnant (gestational diabetes), or polycystic ovary syndrome (PCOS). What are the signs or symptoms? In the early stage of this condition, you may not have symptoms. Symptoms develop slowly and may include: Increased thirst or hunger. Increased urination. Unexplained weight loss. Tiredness (fatigue) or weakness. Vision changes, such as blurry vision. Dark patches on the skin. How is this diagnosed? This condition is diagnosed based on your symptoms, your medical history, a physical exam, and your blood glucose level. Your blood glucose may be checked with one or more of the following blood tests: A fasting blood glucose (FBG) test. You will not be allowed to eat (you will fast) for 8 hours or longer before a blood sample is taken. A random blood glucose test. This test checks blood glucose at any time of day regardless of when you ate. An A1C (hemoglobin A1C) blood test. This test provides information about blood glucose levels over the previous 2-3 months. An oral glucose tolerance test (OGTT). This test measures your blood glucose at two times: After fasting. This is your baseline blood glucose level. Two hours after  drinking a beverage that contains glucose. You may be diagnosed with type 2 diabetes if: Your fasting blood  glucose level is 126 mg/dL (7.0 mmol/L) or higher. Your random blood glucose level is 200 mg/dL (40.9 mmol/L) or higher. Your A1C level is 6.5% or higher. Your oral glucose tolerance test result is higher than 200 mg/dL (81.1 mmol/L). These blood tests may be repeated to confirm your diagnosis. How is this treated? Your treatment may be managed by a specialist called an endocrinologist. Type 2 diabetes may be treated by following instructions from your health care provider about: Making dietary and lifestyle changes. These may include: Following a personalized nutrition plan that is developed by a registered dietitian. Exercising regularly. Finding ways to manage stress. Checking your blood glucose level as often as told. Taking diabetes medicines or insulin daily. This helps to keep your blood glucose levels in the healthy range. Taking medicines to help prevent complications from diabetes. Medicines may include: Aspirin. Medicine to lower cholesterol. Medicine to control blood pressure. Your health care provider will set treatment goals for you. Your goals will be based on your age, other medical conditions you have, and how you respond to diabetes treatment. Generally, the goal of treatment is to maintain the following blood glucose levels: Before meals: 80-130 mg/dL (4.4-7.2 mmol/L). After meals: below 180 mg/dL (10 mmol/L). A1C level: less than 7%. Follow these instructions at home: Questions to ask your health care provider Consider asking the following questions: Should I meet with a certified diabetes care and education specialist? What diabetes medicines do I need, and when should I take them? What equipment will I need to manage my diabetes at home? How often do I need to check my blood glucose? Where can I find a support group for people with diabetes? What number can I call if I have questions? When is my next appointment? General instructions Take over-the-counter and  prescription medicines only as told by your health care provider. Keep all follow-up visits. This is important. Where to find more information For help and guidance and for more information about diabetes, please visit: American Diabetes Association (ADA): www.diabetes.org American Association of Diabetes Care and Education Specialists (ADCES): www.diabeteseducator.org International Diabetes Federation (IDF): DCOnly.dk Contact a health care provider if: Your blood glucose is at or above 240 mg/dL (91.4 mmol/L) for 2 days in a row. You have been sick or have had a fever for 2 days or longer, and you are not getting better. You have any of the following problems for more than 6 hours: You cannot eat or drink. You have nausea and vomiting. You have diarrhea. Get help right away if: You have severe hypoglycemia. This means your blood glucose is lower than 54 mg/dL (3.0 mmol/L). You become confused or you have trouble thinking clearly. You have difficulty breathing. You have moderate or large ketone levels in your urine. These symptoms may represent a serious problem that is an emergency. Do not wait to see if the symptoms will go away. Get medical help right away. Call your local emergency services (911 in the [place] Do not drive yourself to the hospital. Summary Type 2 diabetes mellitus is a long-term, or chronic, disease. In type 2 diabetes, the pancreas does not make enough of a hormone called insulin, or cells in the body do not respond properly to insulin that the body makes. This condition is treated by making dietary and lifestyle changes and taking diabetes medicines or insulin. Your health care provider will set  treatment goals for you. Your goals will be based on your age, other medical conditions you have, and how you respond to diabetes treatment. Keep all follow-up visits. This is important. This information is not intended to replace advice given to you by your health care  provider. Make sure you discuss any questions you have with your health care provider. Document Revised: 06/05/2020 Document Reviewed: 06/05/2020 Elsevier Patient Education  2024 Elsevier Inc.  Metformin  Tablets What is this medication? METFORMIN  (met FOR min) treats type 2 diabetes. It controls blood sugar (glucose) and helps your body use insulin effectively. Changes to diet and exercise are often combined with this medication. This medicine may be used for other purposes; ask your health care provider or pharmacist if you have questions. COMMON BRAND NAME(S): Glucophage  What should I tell my care team before I take this medication? They need to know if you have any of these conditions: Dehydration Frequently drink alcohol Have had diabetic ketoacidosis (DKA) Having a CT or X-ray scan Having surgery Heart attack Heart disease Infection Irregular menstrual cycles Kidney disease Liver disease Low levels of calcium in your blood Low levels of vitamin B in your blood Stroke An unusual or allergic reaction to metformin , other medications, foods, dyes, or preservatives Pregnant or trying to get pregnant Breastfeeding How should I use this medication? Take this medication by mouth with water. Take it as directed on the prescription label at the same time every day. Take it with food at the start of a meal. Keep taking it unless your care team tells you to stop. Talk to your care team about the use of this medication in children. While it may be prescribed for children as young as 10 years for selected conditions, precautions do apply. Overdosage: If you think you have taken too much of this medicine contact a poison control center or emergency room at once. NOTE: This medicine is only for you. Do not share this medicine with others. What if I miss a dose? If you miss a dose, take it as soon as you can. If it is almost time for your next dose, take only that dose. Do not take double or  extra doses. What may interact with this medication? Do not take this medication with any of the following: Certain contrast agents used before, CT, MRI, or X-ray scans This medication may also interact with the following: Acetazolamide Alcohol Cimetidine Dichlorphenamide Dolutegravir Ranolazine Topiramate Vandetanib Zonisamide Some medications may affect your blood sugar levels or hide the symptoms of low blood sugar (hypoglycemia). Talk with your care team about all the medications you take. They may suggest checking your blood sugar levels more often. Medications that may affect your blood sugar levels include: Alcohol Certain antibiotics, such as ciprofloxacin, levofloxacin, sulfamethoxazole; trimethoprim Certain medications for blood pressure or heart disease, such as benazepril, enalapril, lisinopril, losartan, valsartan Certain medications for mental health conditions, such as fluoxetine or olanzapine Diuretics, such as hydrochlorothiazide (HCTZ) Estrogen and progestin hormones Other medications for diabetes Steroid medications, such as prednisone or cortisone Testosterone Thyroid hormones Medications that may mask symptoms of low blood sugar include: Beta-blockers, such as atenolol, metoprolol, propranolol Clonidine Guanethidine Reserpine This list may not describe all possible interactions. Give your health care provider a list of all the medicines, herbs, non-prescription drugs, or dietary supplements you use. Also tell them if you smoke, drink alcohol, or use illegal drugs. Some items may interact with your medicine. What should I watch for while using this medication? Visit  your care team for regular checks on your progress. Tell your care team if your symptoms do not start to get better or if they get worse. You may need blood work done while you are taking this medication. Your care team will monitor your HbA1C (A1C). This test shows what your average blood sugar  (glucose) level was over the past 2 to 3 months. Know the symptoms of low blood sugar and know how to treat it. Always carry a source of quick sugar with you. Examples include hard sugar candy or glucose tablets. Make sure others know that you can choke if you eat or drink if your blood sugar is too low and you are unable to care for yourself. Get medical help at once. Tell your care team if you have high blood sugar. Your medication dose may change if your body is under stress. Some types of stress that may affect your blood sugar include fever, infection, and surgery. Wear a medical ID bracelet or chain. Carry a card that describes your condition. List the medications and doses you take on the card. Make sure you stay hydrated while taking this medication. Drink water often. Eat fruits and veggies that have a high water content. Drink more water when it is hot or you are active. Talk to your care team right away if you have fever, infection, vomiting, diarrhea, or if you sweat a lot while taking this medication. The loss of too much body fluid may make it dangerous for you to take this medication. Tell your care team you are taking this medication before you have surgery or an imaging scan, such as a CT or X-ray. You may need to stop taking this medication for a while before and after the procedure. Your care team will tell you when to stop and when to start taking it again. Make sure you get enough vitamin B12 while you are taking this medication. Discuss the foods you eat and the vitamins you take with your care team. This medication may cause you to ovulate, which may increase your chances of becoming pregnant. Talk with your care team about contraception while you are taking this medication. Contact your care team if you think you might be pregnant. What side effects may I notice from receiving this medication? Side effects that you should report to your care team as soon as possible: Allergic  reactions--skin rash, itching, hives, swelling of the face, lips, tongue, or throat High lactic acid level--muscle pain or cramps, stomach pain, trouble breathing, general discomfort and fatigue Low vitamin B12 level--pain, tingling, or numbness in the hands or feet, muscle weakness, dizziness, confusion, trouble concentrating Side effects that usually do not require medical attention (report these to your care team if they continue or are bothersome): Diarrhea Gas Headache Metallic taste in mouth Nausea This list may not describe all possible side effects. Call your doctor for medical advice about side effects. You may report side effects to FDA at 1-800-FDA-1088. Where should I keep my medication? Keep out of the reach of children and pets. Store at room temperature between 20 and 25 degrees C (68 and 77 degrees F). Get rid of any unused medication after the expiration date. To get rid of medications that are no longer needed or have expired: Take the medication to a medication take-back program. Check with your pharmacy or law enforcement to find a location. If you cannot return the medication, check the label or package insert to see if the medication  should be thrown out in the garbage or flushed down the toilet. If you are not sure, ask your care team. If it is safe to put in the trash, empty the medication out of the container. Mix the medication with cat litter, dirt, coffee grounds, or other unwanted substance. Seal the mixture in a bag or container. Put it in the trash. NOTE: This sheet is a summary. It may not cover all possible information. If you have questions about this medicine, talk to your doctor, pharmacist, or health care provider.  2024 Elsevier/Gold Standard (2023-02-21 00:00:00)    Signed,   Caro Christmas, MD [facility], [facility] 08/11/23 2:00 PM

## 2023-08-12 ENCOUNTER — Encounter: Payer: Self-pay | Admitting: Family Medicine

## 2023-08-12 LAB — COMPREHENSIVE METABOLIC PANEL WITH GFR
ALT: 10 U/L (ref 0–35)
AST: 12 U/L (ref 0–37)
Albumin: 4.3 g/dL (ref 3.5–5.2)
Alkaline Phosphatase: 101 U/L (ref 39–117)
BUN: 17 mg/dL (ref 6–23)
CO2: 22 meq/L (ref 19–32)
Calcium: 9.2 mg/dL (ref 8.4–10.5)
Chloride: 105 meq/L (ref 96–112)
Creatinine, Ser: 0.65 mg/dL (ref 0.40–1.20)
GFR: 104.22 mL/min (ref >60.00)
Glucose, Bld: 222 mg/dL — ABNORMAL HIGH (ref 70–99)
Potassium: 4.2 meq/L (ref 3.5–5.1)
Sodium: 137 meq/L (ref 135–145)
Total Bilirubin: 0.6 mg/dL (ref 0.2–1.2)
Total Protein: 7.2 g/dL (ref 6.0–8.3)

## 2023-08-12 LAB — HEMOGLOBIN A1C: Hgb A1c MFr Bld: 10.6 % — ABNORMAL HIGH (ref 4.6–6.5)

## 2023-08-12 NOTE — Telephone Encounter (Signed)
 Okay to send referral for Dermatology?

## 2023-08-13 ENCOUNTER — Other Ambulatory Visit (HOSPITAL_COMMUNITY): Payer: Self-pay

## 2023-08-13 ENCOUNTER — Telehealth: Payer: Self-pay

## 2023-08-13 NOTE — Telephone Encounter (Signed)
 The Freestyle Libre 14 day, 2 and 3 are being discontinued in September 2025. The new version is 2 plus and 3 plus and are available. We are highly encouraging providers to go ahead and make the switch now. That will prevent us  from doing the prior auth on the old version now and then having to do a prior auth on the new version again in a few months. Thanks!

## 2023-08-13 NOTE — Telephone Encounter (Signed)
 Okay to send in new Rx for updated monitor?

## 2023-08-14 MED ORDER — FREESTYLE LIBRE 3 PLUS SENSOR MISC: 2 refills | Status: DC

## 2023-08-14 NOTE — Telephone Encounter (Signed)
 Yes thanks

## 2023-08-14 NOTE — Telephone Encounter (Signed)
 This has been ordered

## 2023-08-16 ENCOUNTER — Ambulatory Visit: Payer: Self-pay | Admitting: Family Medicine

## 2023-08-16 NOTE — Telephone Encounter (Signed)
Responded by lab notes.

## 2023-08-16 NOTE — Telephone Encounter (Signed)
 Responded by lab notes.  Will need to evaluate the eyebrow lesion prior to referral.  Will have close follow-up for diabetes anyway and can have that added to her next visit.  I will let her know.

## 2023-08-20 ENCOUNTER — Telehealth: Payer: Self-pay | Admitting: Pharmacist

## 2023-08-20 NOTE — Telephone Encounter (Signed)
 Left message on Google messages with CB# [phone] - requesting office visit at [facility] with Clinical Pharmacist to start Continuous Glucose Monitor with Jerrilyn Moras 3 + sample and discuss options for GLP1 agents.

## 2023-08-26 NOTE — Telephone Encounter (Signed)
 Message was left on clinical pharmacist's voicemail by interpreting service that patient will be out of the [place] until June 13th. She is requesting an appointment after that on a Monday, Wednesday or Friday.  Made in office appointment with me for June 20th at 1pm. Had to leave a message thru interpreter service for patient.  CB# [phone]

## 2023-09-02 ENCOUNTER — Encounter: Payer: Self-pay | Admitting: Pharmacist

## 2023-09-09 ENCOUNTER — Telehealth: Payer: Self-pay | Admitting: Pharmacist

## 2023-09-09 ENCOUNTER — Ambulatory Visit: Admitting: Neurology

## 2023-09-09 DIAGNOSIS — Z9189 Other specified personal risk factors, not elsewhere classified: Secondary | ICD-10-CM

## 2023-09-09 DIAGNOSIS — R519 Headache, unspecified: Secondary | ICD-10-CM

## 2023-09-09 DIAGNOSIS — R0683 Snoring: Secondary | ICD-10-CM

## 2023-09-09 DIAGNOSIS — R351 Nocturia: Secondary | ICD-10-CM

## 2023-09-09 DIAGNOSIS — R0689 Other abnormalities of breathing: Secondary | ICD-10-CM

## 2023-09-09 DIAGNOSIS — G4719 Other hypersomnia: Secondary | ICD-10-CM

## 2023-09-09 DIAGNOSIS — E66811 Obesity, class 1: Secondary | ICD-10-CM

## 2023-09-09 NOTE — Telephone Encounter (Signed)
 Patient left message on VM of clinical pharmacist that she would not be able to attend appt 6/20 due to other appointments. She asked is she could come in when her husband has an appt on Monday 6/23 however I have in office appt in our [facility] office that morning. I checked to see what time her husband's appt would be on 6/23 with Dr Ester Helms and it appears his appointment has been cancelled due to scheduling error.  I left a message about this for patient and advised to have her husband call the office about his appointment.  I provided other possible dates that I could meet with her in the office  Other [place] office dates are - Tuesday 6/24; Thursday 6/19 or Friday 6/27.

## 2023-09-09 NOTE — Telephone Encounter (Signed)
 Patient called back. Scheduled appointment for 09/19/2023 at 2:30pm

## 2023-09-12 ENCOUNTER — Other Ambulatory Visit: Admitting: Pharmacist

## 2023-09-19 ENCOUNTER — Other Ambulatory Visit: Payer: Self-pay | Admitting: Pharmacist

## 2023-09-19 DIAGNOSIS — E1165 Type 2 diabetes mellitus with hyperglycemia: Secondary | ICD-10-CM | POA: Insufficient documentation

## 2023-09-19 NOTE — Progress Notes (Unsigned)
 09/19/2023 Name: Melissa Grant MRN: [id number] DOB: 07-14-1975  Chief Complaint  Patient presents with   Diabetes   Subjective:  Melissa Grant is a 48 y.o. year old female who was referred for medication management by their primary care provider, Levora Reyes SAUNDERS, MD. They presented for a face to face visit today. She is accompanied in office today by her husband and tactile ASL interpreter.   They were referred to the pharmacist by their PCP for assistance in managing diabetes and medication access  Patient was referred to start Continuous Glucose Monitor to assess blood glucose and help guide medication changes.   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 She has difficulty with fingerstick glucometers because use to limited sight. There are options that will verbalize reading but she is not able to hear them. We are hoping that since Continuous Glucose Monitor / [name] has a mobile phone app that her Braille interpretation system that connects with her phone will be allow her to be able to monitor her blood glucose.   Care Team: Primary Care Provider: Levora Reyes SAUNDERS, MD ; Next Scheduled Visit: 11/12/2023  Medication Access/Adherence  Current Pharmacy:  [facility] [address], [place] - [address] [place] [address] Phone: [phone] Fax: [phone]  [name] [address] [place] [address] Phone: [phone] Fax: [phone]   Patient reports affordability concerns with their medications: Yes  Patient reports access/transportation concerns to their pharmacy: Yes  - husband provides all transportation Patient reports adherence concerns with their medications:  No      Diabetes:  Current medications: metformin  500mg  twice a day Medications tried in the past: none  Current glucose readings: not currently checking   Patient reports hypoglycemic s/sx including dizziness, shakiness, sweating - about  once per month. Patient denies hyperglycemic symptoms including no polyuria, polydipsia, polyphagia, nocturia, neuropathy, blurred vision.   Current medication access support: none   Objective:  Lab Results  Component Value Date   HGBA1C 10.6 (H) 08/11/2023    Lab Results  Component Value Date   CREATININE 0.65 08/11/2023   BUN 17 08/11/2023   NA 137 08/11/2023   K 4.2 08/11/2023   CL 105 08/11/2023   CO2 22 08/11/2023    Lab Results  Component Value Date   CHOL 142 05/02/2023   HDL 41.30 05/02/2023   LDLCALC 84 05/02/2023   TRIG 84.0 05/02/2023   CHOLHDL 3 05/02/2023    Medications Reviewed Today     Reviewed by Carla Milling, RPH-CPP (Pharmacist) on 09/19/23 at 1506  Med List Status: <None>   Medication Order Taking? Sig Documenting Provider Last Dose Status Informant  Continuous Glucose Sensor (FREESTYLE LIBRE 3 PLUS SENSOR) MISC [id number]  Change sensor every 15 days. Levora Reyes SAUNDERS, MD  Active   Continuous Glucose Sensor (FREESTYLE LIBRE 3 SENSOR) [place] [id number]  1 Application by Does not apply route every 14 (fourteen) days. Place 1 sensor on the skin every 14 days. Use to check glucose continuously Levora Reyes SAUNDERS, MD  Active   metFORMIN  (GLUCOPHAGE ) 500 MG tablet [id number] Yes Take 1 tablet (500 mg total) by mouth 2 (two) times daily with a meal. Start once per day once per week, then twice per day if tolerated. Levora Reyes SAUNDERS, MD  Active   UNABLE TO FIND [id number] Yes Take 2 Doses by mouth daily. Med Name: Cinnamon gummy [provider]  Active  Assessment/Plan:   Diabetes: Currently uncontrolled - Reviewed goal A1c, goal fasting, and goal 2 hour post prandial glucose - Reviewed dietary modifications including limiting serving sizes of high CHO foods like bread, rice, potatoes and corn.  - Recommend to continue metformin  500mg  twice a day. Will check back in 1 to 2 weeks to review Continuous Glucose Monitor and make  medication changes in consult with her PCP as needed.   - Patient and her husband received the following instruction for Freestyle Libre 3+ Personal CGM:   - preparation of placement site - clean with alcohol and allow to dry.  Sensor is to only be place on back of upper arm.  Patient to rotate sides and site.   -care of sensor and site   - reminded that sensor is waterproof up to 3 feet and for 30 minutes.   - Assisted in downloading Libre 3 app to her husband's phone (patient did not have her phone with her today). Also assisted with  account set up.   - reviewed Libre 3 app home screen and how to read and respond to trend arrows.   - reminded that when magnifying glass symbols shows up she is to confirm BG with finger stick before making any treatment decisions.   - pt signed up for [place] view in office. Linked with PCP office.     Follow Up Plan: 1 to 2 weeks  Madelin Ray, PharmD Clinical Pharmacist [facility] Primary Care  Population Health [phone]

## 2023-09-29 ENCOUNTER — Encounter: Payer: Self-pay | Admitting: Pharmacist

## 2023-09-29 NOTE — Progress Notes (Signed)
   09/29/2023 Name: Aleja Yearwood MRN: [id number] DOB: [birth year]  Chief Complaint  Patient presents with   Diabetes    Subjective  Diabetes: Placed Continuous Glucose Monitor - Freestyle Libre 3+ 09/19/2023.  Current medications for diabetes: metformin  ER 500mg  twice a day with food.   Date of Download: 09/29/2023 % Time CGM is active: 68% Average Glucose: 224 mg/dL Glucose Management Indicator: 8.7%  Glucose Variability: 21.5% (goal <36%) Time in Goal:  - Time in range 70-180: 20% - Time above range: 80% - Time below range: 0%  Observed patterns: Highs all day        Objective:  Lab Results  Component Value Date   HGBA1C 10.6 (H) 08/11/2023    Lab Results  Component Value Date   CREATININE 0.65 08/11/2023   BUN 17 08/11/2023   NA 137 08/11/2023   K 4.2 08/11/2023   CL 105 08/11/2023   CO2 22 08/11/2023    Lab Results  Component Value Date   CHOL 142 05/02/2023   HDL 41.30 05/02/2023   LDLCALC 84 05/02/2023   TRIG 84.0 05/02/2023   CHOLHDL 3 05/02/2023    Current Outpatient Medications  Medication Instructions   Continuous Glucose Sensor (FREESTYLE LIBRE 3 PLUS SENSOR) MISC Change sensor every 15 days.   Continuous Glucose Sensor (FREESTYLE LIBRE 3 SENSOR) MISC 1 Application, Does not apply, Every 14 days, Place 1 sensor on the skin every 14 days. Use to check glucose continuously   metFORMIN  (GLUCOPHAGE ) 500 mg, Oral, 2 times daily with meals, Start once per day once per week, then twice per day if tolerated.   UNABLE TO FIND 2 Doses, Daily     Assessment/Plan:   Diabetes: - Currently uncontrolled - Continue metformin  ER 500mg  twice daily with food. Will discuss adding GLP1 with PCP    Madelin Ray, PharmD Clinical Pharmacist [facility] Primary Care  Population Health [phone]

## 2023-10-02 ENCOUNTER — Other Ambulatory Visit: Payer: Self-pay | Admitting: Family Medicine

## 2023-10-02 ENCOUNTER — Other Ambulatory Visit: Payer: Self-pay | Admitting: Pharmacist

## 2023-10-02 ENCOUNTER — Telehealth: Payer: Self-pay | Admitting: Pharmacist

## 2023-10-02 DIAGNOSIS — E1165 Type 2 diabetes mellitus with hyperglycemia: Secondary | ICD-10-CM

## 2023-10-02 MED ORDER — TIRZEPATIDE 2.5 MG/0.5ML ~~LOC~~ SOAJ: 2.5000 mg | SUBCUTANEOUS | 1 refills | Status: DC

## 2023-10-02 NOTE — Telephone Encounter (Signed)
 Called [facility] - Mounjaro  has not been filled yet. Needs prior authorization.  I don't see the prior authorization in cover my meds yet so started prior authorization.  Melissa Grant (Key: [id number]) - [id number] Sent to [facility] thu Cover My Meds

## 2023-10-02 NOTE — Progress Notes (Unsigned)
 Note reviewed from clinical pharmacist, appreciate assistance with Melissa Grant care.  I think addition of GLP/GIP would be helpful.  Will start Mounjaro  2.5 mg daily.  Will just need to verify that there is no family history of multiple endocrine neoplasia or medullary thyroid carcinoma, no personal history of pancreatitis, and discussion of possible side effects of nausea, constipation, gastrointestinal side effects with planned low-dose initially.  RTC precautions if any persistent nausea vomiting or abdominal pain.

## 2023-10-02 NOTE — Progress Notes (Signed)
 Agree with addition of meds - see orders encounter - will order Mounjaro  low dose, has follow up tomorrow with Tammy Eckard.

## 2023-10-02 NOTE — Progress Notes (Addendum)
   10/02/2023 Name: Melissa Grant MRN: [id number] DOB: 11/20/1975  Chief Complaint  Patient presents with   Diabetes    Subjective  Diabetes: Placed Continuous Glucose Monitor - Freestyle Libre 3+ 09/19/2023.  Current medications for diabetes: metformin  ER 500mg  twice a day with food.   Date of Download: 10/02/2023 % Time CGM is active: 90% Average Glucose: 221 mg/dL Glucose Management Indicator: 8.6%  Glucose Variability: 20.1% (goal <36%) Time in Goal:  - Time in range 70-180: 17% - Time above range: 83% - Time below range: 0%  Observed patterns: Highs all day        Objective:  Lab Results  Component Value Date   HGBA1C 10.6 (H) 08/11/2023    Lab Results  Component Value Date   CREATININE 0.65 08/11/2023   BUN 17 08/11/2023   NA 137 08/11/2023   K 4.2 08/11/2023   CL 105 08/11/2023   CO2 22 08/11/2023    Lab Results  Component Value Date   CHOL 142 05/02/2023   HDL 41.30 05/02/2023   LDLCALC 84 05/02/2023   TRIG 84.0 05/02/2023   CHOLHDL 3 05/02/2023    Current Outpatient Medications  Medication Instructions   Continuous Glucose Sensor (FREESTYLE LIBRE 3 PLUS SENSOR) MISC Change sensor every 15 days.   metFORMIN  (GLUCOPHAGE ) 500 mg, Oral, 2 times daily with meals, Start once per day once per week, then twice per day if tolerated.   tirzepatide  (MOUNJARO ) 2.5 mg, Subcutaneous, Weekly   UNABLE TO FIND 2 Doses, Daily     Assessment/Plan:   Diabetes: - Currently uncontrolled - Continue metformin  ER 500mg  twice daily with food. Dr Levora is sending in Mounjaro  2.5mg  to [facility]. Patient will come to [facility] to see me tomorrow for instruction on how to give injection. (Since she is blind, the device for Mounjaro  or Trulicity are preferred because she will not have to change a pen needle and device is one time use) - patient verified no family history of thyroid cancer or personal history of gall bladder issues or pancreatitis.   Madelin Ray, PharmD Clinical Pharmacist [facility] Primary Care  Population Health [phone]

## 2023-10-03 ENCOUNTER — Other Ambulatory Visit (HOSPITAL_COMMUNITY): Payer: Self-pay

## 2023-10-03 ENCOUNTER — Other Ambulatory Visit: Payer: Self-pay | Admitting: Pharmacist

## 2023-10-03 NOTE — Progress Notes (Signed)
   10/03/2023 Name: Melissa Grant MRN: [id number] DOB: 08-21-1975  Chief Complaint  Patient presents with   Diabetes   Patient was seen in the [facility] office today with her husband and interpreter.  Patient is here today for education on how to use Mounjaro . Patient is hearing and visually impaired.   Subjective  Diabetes: Placed Continuous Glucose Monitor - Freestyle Libre 3+ 09/19/2023.  Current medications for diabetes: metformin  ER 500mg  twice a day with food.   Date of Download: 10/02/2023 % Time CGM is active: 90% Average Glucose: 221 mg/dL Glucose Management Indicator: 8.6%  Glucose Variability: 20.1% (goal <36%) Time in Goal:  - Time in range 70-180: 17% - Time above range: 83% - Time below range: 0%  Observed patterns: Highs all day        Objective:  Lab Results  Component Value Date   HGBA1C 10.6 (H) 08/11/2023    Lab Results  Component Value Date   CREATININE 0.65 08/11/2023   BUN 17 08/11/2023   NA 137 08/11/2023   K 4.2 08/11/2023   CL 105 08/11/2023   CO2 22 08/11/2023    Lab Results  Component Value Date   CHOL 142 05/02/2023   HDL 41.30 05/02/2023   LDLCALC 84 05/02/2023   TRIG 84.0 05/02/2023   CHOLHDL 3 05/02/2023    Current Outpatient Medications  Medication Instructions   Continuous Glucose Sensor (FREESTYLE LIBRE 3 PLUS SENSOR) MISC Change sensor every 15 days.   metFORMIN  (GLUCOPHAGE ) 500 mg, Oral, 2 times daily with meals, Start once per day once per week, then twice per day if tolerated.   tirzepatide  (MOUNJARO ) 2.5 mg, Subcutaneous, Weekly   UNABLE TO FIND 2 Doses, Daily     Assessment/Plan:   Diabetes: - Currently uncontrolled - Continue metformin  ER 500mg  twice daily with food.  - First dose of Mounjaro  2.5mg  was given by patient with Clinical Pharmacist guidance. Discussed potential side effects and expected effects of Mounjaro . Patient is worried about losing too much weight but I reassured her that we would  keep an eye on her weight.  - patient verified no family history of thyroid cancer or personal history of gall bladder issues or pancreatitis.   Follow up planned for 2 weeks to assess Continuous Glucose Monitor and possibly increase to Mounjaro  5mg  weekly.   Madelin Ray, PharmD Clinical Pharmacist [facility] Primary Care  Population Health [phone]

## 2023-10-03 NOTE — Telephone Encounter (Signed)
 Received the notification below that patient's Mounjaro  was approved by [facility] Called [facility] and they were able to process and fill it today. LM on VM to notify patient and reminded her to pick up Mounjaro  before our appointment at 3pm today.

## 2023-10-14 ENCOUNTER — Ambulatory Visit (INDEPENDENT_AMBULATORY_CARE_PROVIDER_SITE_OTHER): Admitting: Neurology

## 2023-10-14 DIAGNOSIS — G4733 Obstructive sleep apnea (adult) (pediatric): Secondary | ICD-10-CM

## 2023-10-14 DIAGNOSIS — G4710 Hypersomnia, unspecified: Secondary | ICD-10-CM

## 2023-10-14 DIAGNOSIS — G4719 Other hypersomnia: Secondary | ICD-10-CM

## 2023-10-14 DIAGNOSIS — R0689 Other abnormalities of breathing: Secondary | ICD-10-CM

## 2023-10-14 DIAGNOSIS — R351 Nocturia: Secondary | ICD-10-CM

## 2023-10-14 DIAGNOSIS — Z9189 Other specified personal risk factors, not elsewhere classified: Secondary | ICD-10-CM

## 2023-10-14 DIAGNOSIS — R0683 Snoring: Secondary | ICD-10-CM

## 2023-10-14 DIAGNOSIS — E66811 Obesity, class 1: Secondary | ICD-10-CM

## 2023-10-14 DIAGNOSIS — R519 Headache, unspecified: Secondary | ICD-10-CM

## 2023-10-15 NOTE — Progress Notes (Unsigned)
 Melissa Grant

## 2023-10-16 ENCOUNTER — Telehealth: Payer: Self-pay | Admitting: Pharmacist

## 2023-10-16 ENCOUNTER — Other Ambulatory Visit: Payer: Self-pay | Admitting: Pharmacist

## 2023-10-16 ENCOUNTER — Ambulatory Visit: Payer: Self-pay | Admitting: Neurology

## 2023-10-16 DIAGNOSIS — G4733 Obstructive sleep apnea (adult) (pediatric): Secondary | ICD-10-CM

## 2023-10-16 DIAGNOSIS — E1165 Type 2 diabetes mellitus with hyperglycemia: Secondary | ICD-10-CM

## 2023-10-16 NOTE — Telephone Encounter (Signed)
 Attempt was made to contact patient by phone today for follow up by Clinical Pharmacist regarding diabetes / CGM.  Unable to reach patient. LM on VM with my contact number [phone].

## 2023-10-16 NOTE — Telephone Encounter (Signed)
-----   Message from True Mar sent at 10/16/2023 11:21 AM EDT ----- Please call patient regarding her sleep test at home.  Please remember that she needs interpretation services. Her recent home sleep test showed mild sleep apnea and I would suggest trial of home AutoPap therapy.  If she is agreeable, I have placed an order and we can send the order to a local DME company.   She will need a follow-up appointment in sleep clinic to see how it is going and how well her sleep apnea is treated in about 2 to 3 months after she receives her machine at home. ----- Message ----- From: Mar True, MD Sent: 10/16/2023  11:18 AM EDT To: True Mar, MD

## 2023-10-16 NOTE — Procedures (Signed)
 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  Her nocturia has actually improved after she had an ovarian cyst removed in December 2024.  She is working on weight loss and she was diagnosed with diabetes.   Epworth sleepiness score: 9/24.  BMI: 30.4 kg/m  FINDINGS:   Sleep Summary:   Total Recording Time (hours, min): 8 hours, 53 min  Total Sleep Time (hours, min):  4 hours, 44 min  Percent REM (%):    21.8%   Respiratory Indices:   Calculated pAHI (per hour):  9.8/hour         REM pAHI:    13.7/hour       NREM pAHI: 8.8/hour  Central pAHI: 0.9/hour  Oxygen Saturation Statistics:    Oxygen Saturation (%) Mean: 93%   Minimum oxygen saturation (%):                 66%   O2 Saturation Range (%): 66-99%    O2 Saturation (minutes) <=88%: 3.4 min  Pulse Rate Statistics:   Pulse Mean (bpm):    57/min    Pulse Range (45-84/min)   IMPRESSION: OSA (obstructive sleep apnea)    RECOMMENDATION:  This home sleep test demonstrates overall mild obstructive sleep apnea (by number of events) with a total AHI of 9.8/hour but O2 nadir of 66%. Snoring was detected, of variable intensity but difficult to discern as there was a lot of wake time. Given the patient's medical history and sleep related complaints, therapy with a  positive airway pressure device is a reasonable first-line choice and clinically  recommended. Treatment can be achieved in the form of autoPAP trial/titration at home for now. A full night, in-lab PAP titration study may aid in improving proper treatment settings and with mask fit, if needed, down the road. Alternative treatments may include weight loss (where appropriate) along with avoidance of the supine sleep position (if possible), or an oral appliance in appropriate candidates.   Please note that untreated obstructive sleep apnea may carry additional perioperative morbidity. Patients with significant obstructive sleep apnea should receive perioperative PAP therapy and the surgeons and particularly the anesthesiologist should be informed of the diagnosis and the severity of the sleep disordered breathing. The patient should be cautioned not to drive, work at heights, or operate dangerous or heavy equipment when tired or sleepy. Review and reiteration of good sleep hygiene measures should be pursued with any patient. Other causes of the patient's symptoms, including circadian rhythm disturbances, an underlying mood disorder, medication effect and/or an underlying medical problem cannot be ruled out based on this test. Clinical correlation is recommended.  The patient and her referring provider will be notified of the test results. The patient will be seen in follow up in sleep clinic at [facility], as necessary.  I certify that I have reviewed the raw data recording prior to the issuance of this report in accordance with the standards of the American Academy of Sleep Medicine (AASM).  INTERPRETING PHYSICIAN:   True Mar, MD, PhD Medical Director, [facility]) Diplomat, ABPN (Neurology and Sleep)   [facility] [address] [place], [place] [address] [phone]

## 2023-10-16 NOTE — Telephone Encounter (Signed)
LMVM for pt to return call for sleep study results.  

## 2023-10-17 MED ORDER — TIRZEPATIDE 5 MG/0.5ML ~~LOC~~ SOAJ: 5.0000 mg | SUBCUTANEOUS | 1 refills | Status: DC

## 2023-10-17 NOTE — Progress Notes (Signed)
   10/16/2023 Name: Melissa Grant MRN: [id number] DOB: 06-11-1975  Chief Complaint  Patient presents with   Diabetes   Mrs. Melissa Grant is a 48  yo female. She was last seen by myself 10/03/2023 when Mounjaro  2.5mg  was started. Today we had a MyChart visit to follow up how she is tolerating Mounjaro  and to titrate to next dose if she is tolerating and needed.   Subjective  Diabetes: Placed Continuous Glucose Monitor - Freestyle Libre 3+ 09/19/2023.  Current medications for diabetes: metformin  ER 500mg  twice a day with food and Mounjaro  2.5mg  weekly .   Today she reports she is tolerating Mounjaro  well. Denies nausea or vomiting. She reports her BM are not really regular but they were not prior to starting medication for diabetes. She reports though her BMs are more regular than before.   Date of Download:  % Time CGM is active: 90% Average Glucose: 169 mg/dL (previously was 778)  Glucose Management Indicator: 7.4% (Previously was 8.6%) Glucose Variability: 19.8%% (goal <36%) Time in Goal:  - Time in range 70-180: 68% (previously was 17%) - Time above range: 32% (previously was 83%) - Time below range: 0%  Observed patterns: blood glucose improved since started Mounjaro . Highs mostly afternoon, evening and overnight          Objective:  Lab Results  Component Value Date   HGBA1C 10.6 (H) 08/11/2023    Lab Results  Component Value Date   CREATININE 0.65 08/11/2023   BUN 17 08/11/2023   NA 137 08/11/2023   K 4.2 08/11/2023   CL 105 08/11/2023   CO2 22 08/11/2023    Lab Results  Component Value Date   CHOL 142 05/02/2023   HDL 41.30 05/02/2023   LDLCALC 84 05/02/2023   TRIG 84.0 05/02/2023   CHOLHDL 3 05/02/2023    Current Outpatient Medications  Medication Instructions   Continuous Glucose Sensor (FREESTYLE LIBRE 3 PLUS SENSOR) MISC Change sensor every 15 days.   metFORMIN  (GLUCOPHAGE ) 500 mg, Oral, 2 times daily with meals, Start once per day once per week,  then twice per day if tolerated.   tirzepatide  (MOUNJARO ) 5 mg, Subcutaneous, Weekly   UNABLE TO FIND 2 Doses, Daily     Assessment/Plan:   Diabetes:Currently uncontrolled but has improved - Continue metformin  ER 500mg  twice daily with food.  - Discussed increasing Mounjaro  dose to 5mg  weekly with Dr Levora and he approved dose increase. Rx sent in.   - patient verified no family history of thyroid cancer or personal history of gall bladder issues or pancreatitis. - called her insurance [employer] / [employer] regarding Libre 3 plus sensor prior authorization - they sent over prior authorization. Completed and forwarded to Dr Levora to review and sign. If denied by her Part D plan, then can try to get thru Medicaid and / or Medicare Part B but likely also need to request prior authorization since she is not on insulin but they might make an except due to her being blind.  Provided with 2 Libre 3 plus sensor samples.   Follow up planned for 1 month with Dr Levora.    Madelin Ray, PharmD Clinical Pharmacist [facility] Primary Care  Population Health [phone]

## 2023-10-21 NOTE — Telephone Encounter (Signed)
 Referral sent to [facility].

## 2023-10-22 NOTE — Telephone Encounter (Signed)
 Zott, Glade Boring, Heather CROME, RN; [place], [place]; Darrel, Verneita Got It Thank You

## 2023-10-27 ENCOUNTER — Other Ambulatory Visit (HOSPITAL_COMMUNITY): Payer: Self-pay

## 2023-10-30 ENCOUNTER — Ambulatory Visit: Payer: Medicare Other | Admitting: Family Medicine

## 2023-11-12 ENCOUNTER — Ambulatory Visit: Admitting: Family Medicine

## 2023-11-14 ENCOUNTER — Telehealth: Payer: Self-pay | Admitting: Pharmacist

## 2023-11-14 NOTE — Telephone Encounter (Signed)
 Appeal has been submitted for [employer]. Will advise when response is received or follow up in 1 week. Please be advised that most companies may take 30 days to make a decision. Appeal letter and supporting documentation have been faxed to [phone] on 11/14/2023 @11 :24 am.  Thank you, Devere Pandy, PharmD Clinical Pharmacist  [place]  Direct Dial: [phone]

## 2023-11-17 ENCOUNTER — Encounter (HOSPITAL_BASED_OUTPATIENT_CLINIC_OR_DEPARTMENT_OTHER): Payer: Self-pay | Admitting: Obstetrics & Gynecology

## 2023-11-18 ENCOUNTER — Encounter: Payer: Self-pay | Admitting: Pharmacist

## 2023-11-18 NOTE — Telephone Encounter (Signed)
 Insurance has denied the appeal for [employer] 3 Plus, full letter has been uploaded to the media tab.

## 2023-11-21 ENCOUNTER — Other Ambulatory Visit (HOSPITAL_BASED_OUTPATIENT_CLINIC_OR_DEPARTMENT_OTHER): Payer: Self-pay | Admitting: Obstetrics & Gynecology

## 2023-11-21 DIAGNOSIS — N912 Amenorrhea, unspecified: Secondary | ICD-10-CM

## 2023-12-01 ENCOUNTER — Ambulatory Visit: Admitting: Family Medicine

## 2023-12-10 ENCOUNTER — Encounter (HOSPITAL_BASED_OUTPATIENT_CLINIC_OR_DEPARTMENT_OTHER): Payer: Self-pay | Admitting: Obstetrics & Gynecology

## 2023-12-12 ENCOUNTER — Encounter: Payer: Self-pay | Admitting: Family Medicine

## 2023-12-12 ENCOUNTER — Ambulatory Visit: Admitting: Family Medicine

## 2023-12-12 ENCOUNTER — Other Ambulatory Visit (HOSPITAL_BASED_OUTPATIENT_CLINIC_OR_DEPARTMENT_OTHER): Payer: Self-pay

## 2023-12-12 VITALS — BP 108/60 | HR 71 | Temp 98.1°F | Wt 159.8 lb

## 2023-12-12 DIAGNOSIS — E1165 Type 2 diabetes mellitus with hyperglycemia: Secondary | ICD-10-CM | POA: Diagnosis not present

## 2023-12-12 DIAGNOSIS — L989 Disorder of the skin and subcutaneous tissue, unspecified: Secondary | ICD-10-CM | POA: Diagnosis not present

## 2023-12-12 DIAGNOSIS — Z7984 Long term (current) use of oral hypoglycemic drugs: Secondary | ICD-10-CM | POA: Diagnosis not present

## 2023-12-12 DIAGNOSIS — N912 Amenorrhea, unspecified: Secondary | ICD-10-CM

## 2023-12-12 LAB — COMPREHENSIVE METABOLIC PANEL WITH GFR
ALT: 12 U/L (ref 0–35)
AST: 14 U/L (ref 0–37)
Albumin: 4.2 g/dL (ref 3.5–5.2)
Alkaline Phosphatase: 62 U/L (ref 39–117)
BUN: 16 mg/dL (ref 6–23)
CO2: 28 meq/L (ref 19–32)
Calcium: 9.5 mg/dL (ref 8.4–10.5)
Chloride: 103 meq/L (ref 96–112)
Creatinine, Ser: 0.64 mg/dL (ref 0.40–1.20)
GFR: 104.36 mL/min (ref >60.00)
Glucose, Bld: 141 mg/dL — ABNORMAL HIGH (ref 70–99)
Potassium: 3.7 meq/L (ref 3.5–5.1)
Sodium: 136 meq/L (ref 135–145)
Total Bilirubin: 0.4 mg/dL (ref 0.2–1.2)
Total Protein: 6.8 g/dL (ref 6.0–8.3)

## 2023-12-12 LAB — LIPID PANEL
Cholesterol: 197 mg/dL (ref 0–200)
HDL: 33.8 mg/dL — ABNORMAL LOW (ref >39.00)
LDL Cholesterol: 150 mg/dL — ABNORMAL HIGH (ref 0–99)
NonHDL: 163.55
Total CHOL/HDL Ratio: 6
Triglycerides: 67 mg/dL (ref 0.0–149.0)
VLDL: 13.4 mg/dL (ref 0.0–40.0)

## 2023-12-12 LAB — HEMOGLOBIN A1C: Hgb A1c MFr Bld: 7.2 % — ABNORMAL HIGH (ref 4.6–6.5)

## 2023-12-12 MED ORDER — TIRZEPATIDE 5 MG/0.5ML ~~LOC~~ SOAJ: 5.0000 mg | SUBCUTANEOUS | 1 refills | Status: DC

## 2023-12-12 MED ORDER — METFORMIN HCL 500 MG PO TABS: 500.0000 mg | ORAL_TABLET | Freq: Two times a day (BID) | ORAL | 1 refills | Status: DC

## 2023-12-12 MED ORDER — FREESTYLE LIBRE 3 PLUS SENSOR MISC: 2 refills | Status: DC

## 2023-12-12 NOTE — Progress Notes (Signed)
 Subjective:  Patient ID: Melissa Grant, female    DOB: 06/07/1975  Age: 48 y.o. MRN: [id number]  CC:  Chief Complaint  Patient presents with   Follow-up    Doing fine with meds no concern     HPI Melissa Grant presents for follow up - here with tactile sign language interpreter.   Diabetes: Newly diagnosed in February.  Initial A1c 9.4.  Glucose 338 at that time.  CGM ordered due to difficulty with fingerstick blood glucose measurement given visual impairment.  Initially worked on diet and exercise, with some weight improvement.  Significantly improved carbohydrate intake.  Had not yet started metformin  at her May 19 visit.  We discussed that medication including her concerns for possible side effects, but also discussed need for improved diabetic control.  Unfortunately A1c had increased to 10.6 in May. She is now treated with metformin  500 mg twice daily, and was started on Mounjaro .  Currently taking 5 mg weekly without nausea, constipation, neck swelling, abdominal pain or other new side effects. Less regularity with bowel movements. Rare diarrhea.  Home readings:  Fasting: 120-140 Postprandial: as low as 100. 92 yesterday.  No recent symptomatic lows. Possible low a few months ago. No recent lows.  Occasional skipped meals.  Rare tingling in feet, not persistent.   Microalbumin: Normal ratio in March. No current statin.  LDL 84 in February. Declined optho eval.  HM discussed: Flu vaccine - declines.  Covid booster - declines.  PNA vacccine - declines HIV screening - declines.  Mammogram: due - she plans to schedule.  Foot exam today.   Diabetic Foot Exam - Simple   Simple Foot Form Diabetic Foot exam was performed with the following findings: Yes 12/12/2023  9:45 AM  Visual Inspection No deformities, no ulcerations, no other skin breakdown bilaterally: Yes Sensation Testing Intact to touch and monofilament testing bilaterally: Yes Pulse Check Posterior Tibialis and  Dorsalis pulse intact bilaterally: Yes Comments        Lab Results  Component Value Date   HGBA1C 10.6 (H) 08/11/2023   HGBA1C 9.4 (H) 05/02/2023   Lab Results  Component Value Date   MICROALBUR 1.7 06/11/2023   LDLCALC 84 05/02/2023   CREATININE 0.65 08/11/2023   Wt Readings from Last 3 Encounters:  12/12/23 159 lb 12.8 oz (72.5 kg)  08/11/23 175 lb 9.6 oz (79.7 kg)  07/25/23 177 lb (80.3 kg)  Body mass index is 27.43 kg/m.   Lesion of forehead.  Present for a few years. Would like to have derm eval. No pain, no changes, just annoying. In eyebrow.   History Patient Active Problem List   Diagnosis Date Noted   Type 2 diabetes mellitus with hyperglycemia, without long-term current use of insulin (HCC) 09/19/2023   Legally blind 04/27/2023   Congenital glaucoma of both eyes    OAB (overactive bladder) 09/09/2022   History of kidney stones 09/09/2022   Nocturia 09/09/2022   CATARACT 05/22/2006   Hearing loss 05/22/2006   Mitral valve prolapse 05/22/2006   Past Medical History:  Diagnosis Date   Blind    Cataract    Congenital glaucoma of both eyes    Hearing impaired person, bilateral    Heart murmur    Past Surgical History:  Procedure Laterality Date   EYE SURGERY     EYE SURGERY     EYE SURGERY     EYE SURGERY     EYE SURGERY     EYE SURGERY  EYE SURGERY     EYE SURGERY     HERNIA REPAIR     LAPAROSCOPIC UNILATERAL SALPINGO OOPHERECTOMY Right 03/10/2023   Procedure: LAPAROSCOPIC RIGHT SALPINGECTOMY AND RIGHT OOPHORECTOMY;  Surgeon: Cleotilde Ronal RAMAN, MD;  Location: [facility] OR;  Service: Gynecology;  Laterality: Right;   Allergies  Allergen Reactions   Alphagan [Brimonidine] Itching, Swelling and Other (See Comments)    Watery eyes   Prior to Admission medications   Medication Sig Start Date End Date Taking? Authorizing Provider  Continuous Glucose Sensor (FREESTYLE LIBRE 3 PLUS SENSOR) MISC Change sensor every 15 days. 08/14/23  Yes Levora Reyes SAUNDERS,  MD  metFORMIN  (GLUCOPHAGE ) 500 MG tablet Take 1 tablet (500 mg total) by mouth 2 (two) times daily with a meal. Start once per day once per week, then twice per day if tolerated. 06/11/23  Yes Levora Reyes SAUNDERS, MD  tirzepatide  (MOUNJARO ) 5 MG/0.5ML Pen Inject 5 mg into the skin once a week. 10/17/23  Yes Levora Reyes SAUNDERS, MD  UNABLE TO FIND Take 2 Doses by mouth daily. Med Name: Cinnamon gummy    [provider]   Social History   Socioeconomic History   Marital status: Married    Spouse name: Not on file   Number of children: 2   Years of education: Not on file   Highest education level: Not on file  Occupational History   Not on file  Tobacco Use   Smoking status: Former    Types: Cigarettes   Smokeless tobacco: Former   Tobacco comments:    Briefly as a teenager  Advertising account planner   Vaping status: Never Used  Substance and Sexual Activity   Alcohol use: Yes    Alcohol/week: 3.0 standard drinks of alcohol    Types: 3 Glasses of wine per week    Comment: pt drinks rarely, but when she dose normaly has 3 sevings   Drug use: Yes    Types: Marijuana   Sexual activity: Yes  Other Topics Concern   Not on file  Social History Narrative   Right handed   Caffeine use: Coffee every day, soda sometimes   Lives with husband   Social Drivers of Corporate investment banker Strain: Not on file  Food Insecurity: Food Insecurity Present (04/04/2022)   Hunger Vital Sign    Worried About Running Out of Food in the Last Year: Sometimes true    Ran Out of Food in the Last Year: Sometimes true  Transportation Needs: Not on file  Physical Activity: Not on file  Stress: Not on file  Social Connections: Not on file  Intimate Partner Violence: Not on file    Review of Systems Per HPI  Objective:   Vitals:   12/12/23 0819  BP: 108/60  Pulse: 71  Temp: 98.1 F (36.7 C)  SpO2: 96%  Weight: 159 lb 12.8 oz (72.5 kg)     Physical Exam Vitals reviewed.  Constitutional:       Appearance: Normal appearance. She is well-developed.  HENT:     Head: Normocephalic and atraumatic.  Eyes:     Conjunctiva/sclera: Conjunctivae normal.     Pupils: Pupils are equal, round, and reactive to light.  Neck:     Vascular: No carotid bruit.  Cardiovascular:     Rate and Rhythm: Normal rate and regular rhythm.     Heart sounds: Normal heart sounds.  Pulmonary:     Effort: Pulmonary effort is normal.     Breath sounds:  Normal breath sounds.  Abdominal:     Palpations: Abdomen is soft. There is no pulsatile mass.     Tenderness: There is no abdominal tenderness.  Musculoskeletal:     Right lower leg: No edema.     Left lower leg: No edema.  Skin:    General: Skin is warm and dry.  Neurological:     Mental Status: She is alert and oriented to person, place, and time.  Psychiatric:        Mood and Affect: Mood normal.        Behavior: Behavior normal.        Assessment & Plan:  Melissa Grant is a 48 y.o. female . Type 2 diabetes mellitus with hyperglycemia, without long-term current use of insulin (HCC) - Plan: Comprehensive metabolic panel with GFR, Lipid panel, Hemoglobin A1c, metFORMIN  (GLUCOPHAGE ) 500 MG tablet, tirzepatide  (MOUNJARO ) 5 MG/0.5ML Pen, Continuous Glucose Sensor (FREESTYLE LIBRE 3 PLUS SENSOR) MISC  - Overall stable Home readings, check A1c and adjust plan accordingly.  Importance of regular meals discussed.  Hesitant to increase dose of Mounjaro  given significant appetite suppression.  Same regimen for now.  Lesion of eyebrow - Plan: Ambulatory referral to Dermatology  - Question cyst.  Denies recent changes but she does request evaluation by dermatology.  Referral placed.  Meds ordered this encounter  Medications   metFORMIN  (GLUCOPHAGE ) 500 MG tablet    Sig: Take 1 tablet (500 mg total) by mouth 2 (two) times daily with a meal. Start once per day once per week, then twice per day if tolerated.    Dispense:  180 tablet    Refill:  1    tirzepatide  (MOUNJARO ) 5 MG/0.5ML Pen    Sig: Inject 5 mg into the skin once a week.    Dispense:  2 mL    Refill:  1   Continuous Glucose Sensor (FREESTYLE LIBRE 3 PLUS SENSOR) MISC    Sig: Change sensor every 15 days.    Dispense:  2 each    Refill:  2   Patient Instructions  Thank you for coming in today. No change in medications at this time. If there are any concerns on your bloodwork, I will let you know if we need to adjust meds.  Make sure not to skip meals.  I would not recommend any increased doses of the Mounjaro  for now if that is significantly impacting your appetite.  I will refer you to dermatology.  Take care!     Signed,   Reyes Pines, MD [facility], [facility] 12/12/23 9:51 AM

## 2023-12-12 NOTE — Patient Instructions (Addendum)
 Thank you for coming in today. No change in medications at this time. If there are any concerns on your bloodwork, I will let you know if we need to adjust meds.  Make sure not to skip meals.  I would not recommend any increased doses of the Mounjaro  for now if that is significantly impacting your appetite.  I will refer you to dermatology.  Take care!

## 2023-12-13 LAB — FOLLICLE STIMULATING HORMONE: FSH: 6.8 m[IU]/mL

## 2023-12-13 LAB — ESTRADIOL: Estradiol: 315 pg/mL

## 2023-12-15 ENCOUNTER — Ambulatory Visit (HOSPITAL_BASED_OUTPATIENT_CLINIC_OR_DEPARTMENT_OTHER): Payer: Self-pay | Admitting: Obstetrics & Gynecology

## 2023-12-16 ENCOUNTER — Ambulatory Visit: Payer: Self-pay | Admitting: Family Medicine

## 2023-12-17 ENCOUNTER — Other Ambulatory Visit (HOSPITAL_BASED_OUTPATIENT_CLINIC_OR_DEPARTMENT_OTHER): Payer: Self-pay

## 2023-12-17 MED ORDER — MEDROXYPROGESTERONE ACETATE 10 MG PO TABS
10.0000 mg | ORAL_TABLET | Freq: Every day | ORAL | 0 refills | Status: DC
Start: 2023-12-17 — End: 2024-02-10

## 2023-12-25 ENCOUNTER — Telehealth (HOSPITAL_BASED_OUTPATIENT_CLINIC_OR_DEPARTMENT_OTHER): Payer: Self-pay | Admitting: Pharmacist

## 2023-12-25 ENCOUNTER — Telehealth: Payer: Self-pay

## 2023-12-25 ENCOUNTER — Telehealth: Payer: Self-pay | Admitting: Family Medicine

## 2023-12-25 NOTE — Telephone Encounter (Signed)
 Patient left a message on VM that she has sent a MyChart message to me requesting refills for one of her medications. I do not currently see a request for refills for any medications.  I do see that she is going to need a [employer] replaced soon (around 12/31/2023). I have unsuccessful in getting her insurance to approve continued use of the [employer]. She has coverage with Part D thru [employer] and they require that she use her Medical benefits for sensor coverage. Her Medicare has denied coverage because she does not need insulin therapy and has not had a low blood glucose < 55.   I contacted [facility] and their representative thinks they might be able to get [place] 3 plus sensors covered but might need a peer to peer review. They are starting her order and prior authorization. Mrs. Reder account number with [facility] is [phone].  I can provide another 1 or 2 sensor samples so that we can watch her blood glucose a little more but it has been well controlled over the last few weeks.   Looks like all her other medications are not due to be filled.

## 2023-12-25 NOTE — Telephone Encounter (Signed)
 Type of form received: DME Order  Additional comments:   Received by: Fax  Form should be Faxed/mailed to: (address/ fax #) [phone]   Is patient requesting call for pickup: N/A  Form placed:  Labeled & placed in provider bin  Attach charge sheet.  Provider will determine charge.  Individual made aware of 3-5 business day turn around? N/A

## 2023-12-25 NOTE — Telephone Encounter (Signed)
 Tried calling patient to relay information. Left vm to return call.    CRM:  [facility]  States they're unable to see patient because she has Medicaid, although it is secondary they're still unable to take patient.    Requesting to have Melissa Reyes SAUNDERS, MD contact patient and relay the information as they're unable to reach patient.

## 2023-12-25 NOTE — Telephone Encounter (Signed)
 Request/update has been sent to referral coordinator. Will call back when I have an update.   Copied from CRM [id number]. Topic: Referral - Question >> Dec 25, 2023  1:54 PM Burnard DEL wrote: Reason for CRM: Patient returned call to [facility] with interpreter on the line regarding dermatology referral.Patient would still like for a referral to be sent out to a dermatologist that may be in network with her insurance.

## 2023-12-25 NOTE — Telephone Encounter (Signed)
 Obtained from front desk. Placed in providers folder at nurse station for review

## 2023-12-26 NOTE — Telephone Encounter (Signed)
 Paperwork completed and placed in fax bin at back nurse station

## 2023-12-26 NOTE — Telephone Encounter (Signed)
 Will forward to referrals staff - any other options for dermatology?

## 2023-12-26 NOTE — Telephone Encounter (Signed)
 Obtained documents. Faxed. Scanned document. Placed in folder up front

## 2023-12-30 NOTE — Telephone Encounter (Signed)
 Faxed last office visit with PCP, other supporting visits and labs, as well as Continuous Glucose Monitor reports to help support continued use of Continuous Glucose Monitor due to patients visual and hearing impairments.

## 2024-01-01 ENCOUNTER — Telehealth: Payer: Self-pay

## 2024-01-01 ENCOUNTER — Telehealth: Payer: Self-pay | Admitting: Adult Health

## 2024-01-01 NOTE — Telephone Encounter (Signed)
 Appointment needs to be cancelled the interpreter is NOT available.

## 2024-01-01 NOTE — Telephone Encounter (Signed)
 Copied from CRM [id number]. Topic: General - Other >> Jan 01, 2024 11:56 AM Martinique E wrote: Reason for CRM: *Interpreter used for call* Patient stated that her husband will be coming by the office today before close to pick up her Libre 3 sensor, patient did not have an exact timeframe of when he will be by.

## 2024-01-02 ENCOUNTER — Telehealth: Payer: Self-pay | Admitting: Pharmacist

## 2024-01-02 NOTE — Telephone Encounter (Signed)
 Called [facility] regarding her Freestyle Libre 3 sensors order. Verified they did received chart notes that were faxed over 12/30/2023.  Representative at [facility] states they are in process of getting prior authorization from her Medicare  / [facility] plan.

## 2024-01-05 ENCOUNTER — Telehealth: Payer: Self-pay | Admitting: Adult Health

## 2024-01-05 ENCOUNTER — Encounter (HOSPITAL_BASED_OUTPATIENT_CLINIC_OR_DEPARTMENT_OTHER): Payer: Self-pay | Admitting: Obstetrics & Gynecology

## 2024-01-05 ENCOUNTER — Ambulatory Visit: Admitting: Adult Health

## 2024-01-05 NOTE — Telephone Encounter (Signed)
 ERROR

## 2024-01-08 ENCOUNTER — Encounter: Payer: Self-pay | Admitting: Adult Health

## 2024-01-08 ENCOUNTER — Ambulatory Visit (INDEPENDENT_AMBULATORY_CARE_PROVIDER_SITE_OTHER): Admitting: Adult Health

## 2024-01-08 VITALS — BP 111/73 | HR 70 | Ht 65.0 in | Wt 158.6 lb

## 2024-01-08 DIAGNOSIS — G4733 Obstructive sleep apnea (adult) (pediatric): Secondary | ICD-10-CM | POA: Diagnosis not present

## 2024-01-08 NOTE — Patient Instructions (Signed)
 Continue using CPAP nightly and greater than 4 hours each night If your symptoms worsen or you develop new symptoms please let us  know.

## 2024-01-08 NOTE — Progress Notes (Signed)
 PATIENT: Melissa Grant DOB: [birth year]  REASON FOR VISIT: follow up HISTORY FROM: patient PRIMARY NEUROLOGIST: Dr. Buck   Chief Complaint  Patient presents with   Follow-up    Pt in 5 with interpreter and husband  Pt here for cpap f/u      HISTORY OF PRESENT ILLNESS: Today 01/08/24:  Melissa Grant is a 48 y.o. female with a history of OSA on CPAP. Returns today for follow-up.  She is here today with her husband and the interpreter.  She reports that she cannot tell the benefit since she started using CPAP.  Currently uses the fullface mask.  She states that she wakes up more refreshed and ready to start the day.  Her download is below     HISTORY Melissa Grant is a 48 year old female with an underlying medical history of congenital glaucoma of both eyes with blindness, cataract, hearing impairment, heart murmur, mitral valve prolapse, history of kidney stones, use, overactive bladder, and mild obesity, who reports snoring, waking up with a sense of gasping for air and not feeling fully rested, and waking up in the middle of the night.  Her nocturia has actually improved after she had an ovarian cyst removed in December 2024.  She is working on weight loss and she was diagnosed with diabetes.  She has lost about 21 pounds in the past 12 weeks.  She has nocturia about once per night.  Epworth sleepiness score is about 9 out of 24, fatigue severity score is 33 out of 63.  She lives with her husband.  She has 2 children and mom currently mostly staying with their father.  She is a non-smoker and does not utilize alcohol, she drinks caffeine in the form of coffee, about 3 cups/day.  She does not have a set bedtime and rise time, typically in bed somewhere between 11 PM and 1 AM and rise time between 3 AM and 8 AM.  She has had very occasional morning headaches.  I reviewed the office note from 06/11/2023.  She was advised to seek sleep apnea evaluation after she saw urology.  REVIEW OF SYSTEMS:  Out of a complete 14 system review of symptoms, the patient complains only of the following symptoms, and all other reviewed systems are negative.  FSS ESS  ALLERGIES: Allergies  Allergen Reactions   Alphagan [Brimonidine] Itching, Swelling and Other (See Comments)    Watery eyes    HOME MEDICATIONS: Outpatient Medications Prior to Visit  Medication Sig Dispense Refill   Continuous Glucose Sensor (FREESTYLE LIBRE 3 PLUS SENSOR) MISC Change sensor every 15 days. 2 each 2   medroxyPROGESTERone  (PROVERA ) 10 MG tablet Take 1 tablet (10 mg total) by mouth daily. 10 tablet 0   metFORMIN  (GLUCOPHAGE ) 500 MG tablet Take 1 tablet (500 mg total) by mouth 2 (two) times daily with a meal. Start once per day once per week, then twice per day if tolerated. 180 tablet 1   tirzepatide  (MOUNJARO ) 5 MG/0.5ML Pen Inject 5 mg into the skin once a week. 2 mL 1   UNABLE TO FIND Take 2 Doses by mouth daily. Med Name: Cinnamon gummy     UNABLE TO FIND Med Name: morning power     No facility-administered medications prior to visit.    PAST MEDICAL HISTORY: Past Medical History:  Diagnosis Date   Blind    Cataract    Congenital glaucoma of both eyes    Hearing impaired person, bilateral    Heart murmur  PAST SURGICAL HISTORY: Past Surgical History:  Procedure Laterality Date   EYE SURGERY     EYE SURGERY     EYE SURGERY     EYE SURGERY     EYE SURGERY     EYE SURGERY     EYE SURGERY     EYE SURGERY     HERNIA REPAIR     LAPAROSCOPIC UNILATERAL SALPINGO OOPHERECTOMY Right 03/10/2023   Procedure: LAPAROSCOPIC RIGHT SALPINGECTOMY AND RIGHT OOPHORECTOMY;  Surgeon: Cleotilde Ronal RAMAN, MD;  Location: [facility] OR;  Service: Gynecology;  Laterality: Right;    FAMILY HISTORY: Family History  Problem Relation Age of Onset   Arthritis Mother    Hyperlipidemia Father    Heart disease Father    Hypertension Father    Atrial fibrillation Father    Arthritis Father    Alzheimer's disease Father     Diabetes Sister    Diabetes Maternal Grandmother     SOCIAL HISTORY: Social History   Socioeconomic History   Marital status: Married    Spouse name: Not on file   Number of children: 2   Years of education: Not on file   Highest education level: Not on file  Occupational History   Not on file  Tobacco Use   Smoking status: Former    Types: Cigarettes   Smokeless tobacco: Former   Tobacco comments:    Briefly as a teenager  Advertising account planner   Vaping status: Never Used  Substance and Sexual Activity   Alcohol use: Yes    Comment: occ   Drug use: Yes    Types: Marijuana   Sexual activity: Yes  Other Topics Concern   Not on file  Social History Narrative   Right handed   Caffeine use: Coffee every day, soda sometimes   Lives with husband   Social Drivers of Corporate investment banker Strain: Not on file  Food Insecurity: Food Insecurity Present (04/04/2022)   Hunger Vital Sign    Worried About Running Out of Food in the Last Year: Sometimes true    Ran Out of Food in the Last Year: Sometimes true  Transportation Needs: Not on file  Physical Activity: Not on file  Stress: Not on file  Social Connections: Not on file  Intimate Partner Violence: Not on file      PHYSICAL EXAM  Vitals:   01/08/24 1331  BP: 111/73  Pulse: 70  Weight: 158 lb 9.6 oz (71.9 kg)  Height: 5' 5 (1.651 m)   Body mass index is 26.39 kg/m.  Generalized: Well developed, in no acute distress  Chest: Lungs clear to auscultation bilaterally  Neurological examination  Mentation: Alert oriented to time, place, history taking. Follows all commands speech and language fluent Cranial nerve II-XII: Extraocular movements were full, visual field were full on confrontational test Head turning and shoulder shrug  were normal and symmetric. Motor: The motor testing reveals 5 over 5 strength of all 4 extremities. Good symmetric motor tone is noted throughout.  Sensory: Sensory testing is intact to  soft touch on all 4 extremities. No evidence of extinction is noted.  Gait and station: Gait is normal.    DIAGNOSTIC DATA (LABS, IMAGING, TESTING) - I reviewed patient records, labs, notes, testing and imaging myself where available.  Lab Results  Component Value Date   WBC 6.1 03/10/2023   HGB 12.7 03/10/2023   HCT 40.4 03/10/2023   MCV 88.6 03/10/2023   PLT 342 03/10/2023  Component Value Date/Time   NA 136 12/12/2023 0953   NA 140 08/27/2019 1556   K 3.7 12/12/2023 0953   CL 103 12/12/2023 0953   CO2 28 12/12/2023 0953   GLUCOSE 141 (H) 12/12/2023 0953   BUN 16 12/12/2023 0953   BUN 20 08/27/2019 1556   CREATININE 0.64 12/12/2023 0953   CALCIUM 9.5 12/12/2023 0953   PROT 6.8 12/12/2023 0953   PROT 7.3 08/27/2019 1556   ALBUMIN 4.2 12/12/2023 0953   ALBUMIN 4.5 08/27/2019 1556   AST 14 12/12/2023 0953   ALT 12 12/12/2023 0953   ALKPHOS 62 12/12/2023 0953   BILITOT 0.4 12/12/2023 0953   BILITOT 0.3 08/27/2019 1556   GFRNONAA >60 03/10/2023 0931   GFRAA 88 08/27/2019 1556   Lab Results  Component Value Date   CHOL 197 12/12/2023   HDL 33.80 (L) 12/12/2023   LDLCALC 150 (H) 12/12/2023   TRIG 67.0 12/12/2023   CHOLHDL 6 12/12/2023   Lab Results  Component Value Date   HGBA1C 7.2 (H) 12/12/2023      ASSESSMENT AND PLAN 48 y.o. year old female  has a past medical history of Blind, Cataract, Congenital glaucoma of both eyes, Hearing impaired person, bilateral, and Heart murmur. here with:  OSA on CPAP  - CPAP compliance excellent - Good treatment of AHI  - Encourage patient to use CPAP nightly and > 4 hours each night - F/U in 1 year or sooner if needed    Duwaine Russell, MSN, NP-C 01/08/2024, 2:20 PM [facility] [address] [place], [place] [address] [phone]  The patient's condition requires frequent monitoring and adjustments in the treatment plan, reflecting the ongoing complexity of care.  This provider is  the continuing focal point for all needed services for this condition.

## 2024-01-12 NOTE — Telephone Encounter (Signed)
 Received these forms again - Re-faxed the originals back to them 10/20 @ 1:45pm

## 2024-01-14 ENCOUNTER — Encounter: Payer: Self-pay | Admitting: Family Medicine

## 2024-01-23 ENCOUNTER — Encounter: Payer: Self-pay | Admitting: Family Medicine

## 2024-01-26 NOTE — Telephone Encounter (Signed)
 Patient has appt 03/12/24. Would you like to see her sooner to address these concerns?

## 2024-01-29 ENCOUNTER — Telehealth: Payer: Self-pay

## 2024-01-29 NOTE — Telephone Encounter (Signed)
 Pt needs referral sent to another Dermatology office that will be in network with her insurance. Please advise.

## 2024-01-29 NOTE — Telephone Encounter (Signed)
 Copied from CRM #[id number]. Topic: Referral - Question >> Jan 29, 2024  3:15 PM Robinson H wrote: Reason for CRM: [facility] calling stating patient reached out to their office again to schedule from referral, [facility] states they are out of network with patients insurance. States patient requested Dermatology office to reach out to her via MyChart since patient is death but states their office doesn't have access to MyChart. [facility]anda states that [facility] is in network with patients insurance.  [facility] [phone] Ext [phone]

## 2024-02-04 ENCOUNTER — Telehealth: Payer: Self-pay

## 2024-02-04 NOTE — Telephone Encounter (Signed)
 Copied from CRM [id number]. Topic: Referral - Status >> Feb 04, 2024  1:46 PM Eva FALCON wrote: Reason for CRM: Chanel from [facility] states the patient called in for her referral, but they advise pt they have not received it. Chanel is requesting for it to be faxed again [phone]. Any questions or concerns please call [phone].

## 2024-02-09 ENCOUNTER — Other Ambulatory Visit: Payer: Self-pay | Admitting: Family Medicine

## 2024-02-09 ENCOUNTER — Encounter (HOSPITAL_BASED_OUTPATIENT_CLINIC_OR_DEPARTMENT_OTHER): Payer: Self-pay | Admitting: Obstetrics & Gynecology

## 2024-02-09 DIAGNOSIS — E1165 Type 2 diabetes mellitus with hyperglycemia: Secondary | ICD-10-CM

## 2024-02-10 ENCOUNTER — Other Ambulatory Visit (HOSPITAL_BASED_OUTPATIENT_CLINIC_OR_DEPARTMENT_OTHER): Payer: Self-pay | Admitting: Obstetrics & Gynecology

## 2024-02-10 MED ORDER — NORETHINDRONE 0.35 MG PO TABS: 1.0000 | ORAL_TABLET | Freq: Every day | ORAL | 4 refills | Status: DC

## 2024-02-25 NOTE — Telephone Encounter (Signed)
 Referral has been faxed again to [phone].

## 2024-03-01 ENCOUNTER — Encounter (HOSPITAL_BASED_OUTPATIENT_CLINIC_OR_DEPARTMENT_OTHER): Payer: Self-pay | Admitting: Obstetrics & Gynecology

## 2024-03-01 ENCOUNTER — Other Ambulatory Visit (HOSPITAL_BASED_OUTPATIENT_CLINIC_OR_DEPARTMENT_OTHER): Payer: Self-pay | Admitting: Obstetrics & Gynecology

## 2024-03-08 ENCOUNTER — Other Ambulatory Visit (HOSPITAL_BASED_OUTPATIENT_CLINIC_OR_DEPARTMENT_OTHER): Payer: Self-pay | Admitting: Obstetrics & Gynecology

## 2024-03-09 ENCOUNTER — Other Ambulatory Visit (HOSPITAL_BASED_OUTPATIENT_CLINIC_OR_DEPARTMENT_OTHER): Payer: Self-pay

## 2024-03-09 DIAGNOSIS — N912 Amenorrhea, unspecified: Secondary | ICD-10-CM

## 2024-03-09 MED ORDER — MEDROXYPROGESTERONE ACETATE 10 MG PO TABS: ORAL_TABLET | ORAL | 5 refills | Status: AC

## 2024-03-12 ENCOUNTER — Ambulatory Visit: Admitting: Family Medicine

## 2024-03-12 ENCOUNTER — Encounter: Payer: Self-pay | Admitting: Family Medicine

## 2024-03-12 VITALS — BP 130/68 | HR 79 | Temp 98.0°F | Resp 16 | Ht 65.0 in | Wt 146.4 lb

## 2024-03-12 DIAGNOSIS — Z7985 Long-term (current) use of injectable non-insulin antidiabetic drugs: Secondary | ICD-10-CM

## 2024-03-12 DIAGNOSIS — Z7984 Long term (current) use of oral hypoglycemic drugs: Secondary | ICD-10-CM

## 2024-03-12 DIAGNOSIS — E785 Hyperlipidemia, unspecified: Secondary | ICD-10-CM | POA: Diagnosis not present

## 2024-03-12 DIAGNOSIS — N912 Amenorrhea, unspecified: Secondary | ICD-10-CM

## 2024-03-12 DIAGNOSIS — E1165 Type 2 diabetes mellitus with hyperglycemia: Secondary | ICD-10-CM

## 2024-03-12 DIAGNOSIS — F439 Reaction to severe stress, unspecified: Secondary | ICD-10-CM | POA: Diagnosis not present

## 2024-03-12 LAB — LIPID PANEL
Cholesterol: 150 mg/dL (ref 28–200)
HDL: 39.8 mg/dL
LDL Cholesterol: 98 mg/dL (ref 10–99)
NonHDL: 109.7
Total CHOL/HDL Ratio: 4
Triglycerides: 57 mg/dL (ref 10.0–149.0)
VLDL: 11.4 mg/dL (ref 0.0–40.0)

## 2024-03-12 LAB — COMPREHENSIVE METABOLIC PANEL WITH GFR
ALT: 12 U/L (ref 3–35)
AST: 13 U/L (ref 5–37)
Albumin: 4.5 g/dL (ref 3.5–5.2)
Alkaline Phosphatase: 52 U/L (ref 39–117)
BUN: 20 mg/dL (ref 6–23)
CO2: 29 meq/L (ref 19–32)
Calcium: 9.7 mg/dL (ref 8.4–10.5)
Chloride: 104 meq/L (ref 96–112)
Creatinine, Ser: 0.77 mg/dL (ref 0.40–1.20)
GFR: 90.94 mL/min
Glucose, Bld: 148 mg/dL — ABNORMAL HIGH (ref 70–99)
Potassium: 4.1 meq/L (ref 3.5–5.1)
Sodium: 139 meq/L (ref 135–145)
Total Bilirubin: 0.5 mg/dL (ref 0.2–1.2)
Total Protein: 7.1 g/dL (ref 6.0–8.3)

## 2024-03-12 LAB — HEMOGLOBIN A1C: Hgb A1c MFr Bld: 6.4 % (ref 4.6–6.5)

## 2024-03-12 MED ORDER — METFORMIN HCL 500 MG PO TABS: 500.0000 mg | ORAL_TABLET | Freq: Two times a day (BID) | ORAL | 1 refills | Status: AC

## 2024-03-12 MED ORDER — MOUNJARO 5 MG/0.5ML ~~LOC~~ SOAJ: 5.0000 mg | SUBCUTANEOUS | 1 refills | Status: AC

## 2024-03-12 NOTE — Progress Notes (Unsigned)
 "  Subjective:  Patient ID: Melissa Grant, female    DOB: 1975-09-09  Age: 48 y.o. MRN: [id number]  CC:  Chief Complaint  Patient presents with   Follow-up    3 month, Fasting, Needs AWV No concerns Flu shot- declined Mammogram- never had  Eye exam- does not see one      HPI Melissa Grant presents for follow up . Presents with tactile sign language interpreter.   Diabetes: New diagnosis in February.  Initial A1c 9.4, then 10.6 in May with initial approach diet, exercise and weight improvement.  Started on metformin  and Mounjaro , up to 5 mg weekly at her September visit.  A1c has improved at that time at 7.2.  Was tolerating medication at that time. Continued same regimen. Home readings 130-140, some 160's at time, 200's at times no recent symptomatic lows.  Intermittent foot dysesthesias discussed at September visit, possible diabetic neuropathy discussed.no new meds. No recent sx's - improved.  Rare diarrhea once per month.  Microalbumin: Normal ratio in March. Borderline LDL of 84 in February. Mammogram has been discussed previously, and she planned to schedule. Vaccines declined. Some stress, anxiety at times.   Lab Results  Component Value Date   HGBA1C 7.2 (H) 12/12/2023   HGBA1C 10.6 (H) 08/11/2023   HGBA1C 9.4 (H) 05/02/2023   Lab Results  Component Value Date   MICROALBUR 1.7 06/11/2023   LDLCALC 150 (H) 12/12/2023   CREATININE 0.64 12/12/2023   Wt Readings from Last 3 Encounters:  03/12/24 146 lb 6 oz (66.4 kg)  01/08/24 158 lb 9.6 oz (71.9 kg)  12/12/23 159 lb 12.8 oz (72.5 kg)     History Patient Active Problem List   Diagnosis Date Noted   Type 2 diabetes mellitus with hyperglycemia, without long-term current use of insulin (HCC) 09/19/2023   Legally blind 04/27/2023   Congenital glaucoma of both eyes    OAB (overactive bladder) 09/09/2022   History of kidney stones 09/09/2022   Nocturia 09/09/2022   CATARACT 05/22/2006   Hearing loss 05/22/2006    Mitral valve prolapse 05/22/2006   Past Medical History:  Diagnosis Date   Blind    Cataract    Congenital glaucoma of both eyes    Hearing impaired person, bilateral    Heart murmur    Past Surgical History:  Procedure Laterality Date   EYE SURGERY     EYE SURGERY     EYE SURGERY     EYE SURGERY     EYE SURGERY     EYE SURGERY     EYE SURGERY     EYE SURGERY     HERNIA REPAIR     LAPAROSCOPIC UNILATERAL SALPINGO OOPHERECTOMY Right 03/10/2023   Procedure: LAPAROSCOPIC RIGHT SALPINGECTOMY AND RIGHT OOPHORECTOMY;  Surgeon: Cleotilde Ronal RAMAN, MD;  Location: [facility] OR;  Service: Gynecology;  Laterality: Right;   Allergies[1] Prior to Admission medications  Medication Sig Start Date End Date Taking? Authorizing Provider  Continuous Glucose Sensor (FREESTYLE LIBRE 3 PLUS SENSOR) MISC Change sensor every 15 days. 12/12/23  Yes Levora Reyes SAUNDERS, MD  medroxyPROGESTERone  (PROVERA ) 10 MG tablet Take 1 tablet x 10 days every other month if no menses. 03/09/24  Yes Cleotilde Ronal RAMAN, MD  metFORMIN  (GLUCOPHAGE ) 500 MG tablet Take 1 tablet (500 mg total) by mouth 2 (two) times daily with a meal. Start once per day once per week, then twice per day if tolerated. 12/12/23  Yes Levora Reyes SAUNDERS, MD  MOUNJARO  5 MG/0.5ML Pen INJECT  1/2 (ONE-HALF) ML SUBCUTANEOUSLY  ONCE A WEEK 02/10/24  Yes Levora Reyes SAUNDERS, MD  UNABLE TO FIND Med Name: morning power   Yes [provider]  UNABLE TO FIND Take 2 Doses by mouth daily. Med Name: Cinnamon gummy Patient not taking: Reported on 03/12/2024    [provider]   Social History   Socioeconomic History   Marital status: Married    Spouse name: Not on file   Number of children: 2   Years of education: Not on file   Highest education level: Not on file  Occupational History   Not on file  Tobacco Use   Smoking status: Former    Types: Cigarettes   Smokeless tobacco: Former   Tobacco comments:    Briefly as a teenager  Advertising Account [employer]    Vaping status: Never Used  Substance and Sexual Activity   Alcohol use: Yes    Comment: occ   Drug use: Yes    Types: Marijuana   Sexual activity: Yes  Other Topics Concern   Not on file  Social History Narrative   Right handed   Caffeine use: Coffee every day, soda sometimes   Lives with husband   Social Drivers of Health   Tobacco Use: Medium Risk (03/12/2024)   Patient History    Smoking Tobacco Use: Former    Smokeless Tobacco Use: Former    Passive Exposure: Not on Actuary Strain: Not on file  Food Insecurity: Food Insecurity Present (04/04/2022)   Hunger Vital Sign    Worried About Running Out of Food in the Last Year: Sometimes true    Ran Out of Food in the Last Year: Sometimes true  Transportation Needs: Not on file  Physical Activity: Not on file  Stress: Not on file  Social Connections: Not on file  Intimate Partner Violence: Not on file  Depression (PHQ2-9): Low Risk (03/12/2024)   Depression (PHQ2-9)    PHQ-2 Score: 0  Alcohol Screen: Not on file  Housing: Not on file  Utilities: Not on file  Health Literacy: Not on file    Review of Systems  Constitutional:  Negative for fatigue and unexpected weight change.  Respiratory:  Negative for chest tightness and shortness of breath.   Cardiovascular:  Negative for chest pain, palpitations and leg swelling.  Gastrointestinal:  Negative for abdominal pain and blood in stool.  Neurological:  Negative for dizziness, syncope, light-headedness and headaches.     Objective:   Vitals:   03/12/24 1011  BP: 130/68  Pulse: 79  Resp: 16  Temp: 98 F (36.7 C)  TempSrc: Oral  SpO2: 98%  Weight: 146 lb 6 oz (66.4 kg)  Height: 5' 5 (1.651 m)     Physical Exam Vitals reviewed.  Constitutional:      Appearance: Normal appearance. She is well-developed.  HENT:     Head: Normocephalic and atraumatic.  Eyes:     Conjunctiva/sclera: Conjunctivae normal.     Pupils: Pupils are equal, round,  and reactive to light.  Neck:     Vascular: No carotid bruit.  Cardiovascular:     Rate and Rhythm: Normal rate and regular rhythm.     Heart sounds: Normal heart sounds.  Pulmonary:     Effort: Pulmonary effort is normal.     Breath sounds: Normal breath sounds.  Abdominal:     Palpations: Abdomen is soft. There is no pulsatile mass.     Tenderness: There is no abdominal tenderness.  Musculoskeletal:     Right lower leg: No edema.     Left lower leg: No edema.  Skin:    General: Skin is warm and dry.  Neurological:     Mental Status: She is alert and oriented to person, place, and time.  Psychiatric:        Mood and Affect: Mood normal.        Behavior: Behavior normal.        Assessment & Plan:  Melissa Grant is a 48 y.o. female . Type 2 diabetes mellitus with hyperglycemia, without long-term current use of insulin (HCC) - Plan: Comprehensive metabolic panel with GFR, Hemoglobin A1c, tirzepatide  (MOUNJARO ) 5 MG/0.5ML Pen, metFORMIN  (GLUCOPHAGE ) 500 MG tablet  Hyperlipidemia, unspecified hyperlipidemia type - Plan: Lipid panel  Situational stress  Amenorrhea   Meds ordered this encounter  Medications   tirzepatide  (MOUNJARO ) 5 MG/0.5ML Pen    Sig: Inject 5 mg into the skin once a week. INJECT 1/2 (ONE-HALF) ML SUBCUTANEOUSLY  ONCE A WEEK    Dispense:  6 mL    Refill:  1   metFORMIN  (GLUCOPHAGE ) 500 MG tablet    Sig: Take 1 tablet (500 mg total) by mouth 2 (two) times daily with a meal. Start once per day once per week, then twice per day if tolerated.    Dispense:  180 tablet    Refill:  1   Patient Instructions  Thank you for coming in today.  I will check some lab work, no change in meds for now.  If cholesterol is still elevated we may need to consider a low-dose of a cholesterol medication but I will let you know.  Let me know if any persistent stress or anxiety symptoms and we certainly can discuss that further but as we discussed this time a year can  sometimes be stressful.  I included some information below on managing stress but again happy to see you if the symptoms persist.  Take care!  Managing Stress, Adult Feeling a certain amount of stress is normal. Stress helps our body and mind get ready to deal with the demands of life. Stress hormones can motivate you to do well at work and meet your responsibilities. But severe or long-term (chronic) stress can affect your mental and physical health. Chronic stress puts you at higher risk for: Anxiety and depression. Other health problems such as digestive problems, muscle aches, heart disease, high blood pressure, and stroke. What are the causes? Common causes of stress include: Demands from work, such as deadlines, feeling overworked, or having long hours. Pressures at home, such as money issues, disagreements with a spouse, or parenting issues. Pressures from major life changes, such as divorce, moving, loss of a loved one, or chronic illness. You may be at higher risk for stress-related problems if you: Do not get enough sleep. Are in poor health. Do not have emotional support. Have a mental health disorder such as anxiety or depression. How to recognize stress Stress can make you: Have trouble sleeping. Feel sad, anxious, irritable, or overwhelmed. Lose your appetite. Overeat or want to eat unhealthy foods. Want to use drugs or alcohol. Stress can also cause physical symptoms, such as: Sore, tense muscles, especially in the shoulders and neck. Headaches. Trouble breathing. A faster heart rate. Stomach pain, nausea, or vomiting. Diarrhea or constipation. Trouble concentrating. Follow these instructions at home: Eating and drinking Eat a healthy diet. This includes: Eating foods that are high in fiber, such as beans, whole grains,  and fresh fruits and vegetables. Limiting foods that are high in fat and processed sugars, such as fried or sweet foods. Do not skip meals or  overeat. Drink enough fluid to keep your urine pale yellow. Alcohol use Do not drink alcohol if: Your health care provider tells you not to drink. You are pregnant, may be pregnant, or are planning to become pregnant. Drinking alcohol is a way some people try to ease their stress. This can be dangerous, so if you drink alcohol: Limit how much you have to: 0-1 drink a day for women. 0-2 drinks a day for men. Know how much alcohol is in your drink. In the [place] one drink equals one 12 oz bottle of beer (355 mL), one 5 oz glass of wine (148 mL), or one 1 oz glass of hard liquor (44 mL). Activity  Include 30 minutes of exercise in your daily schedule. Exercise is a good stress reducer. Include time in your day for an activity that you find relaxing. Try taking a walk, going on a bike ride, reading a book, or listening to music. Schedule your time in a way that lowers stress, and keep a regular schedule. Focus on doing what is most important to get done. Lifestyle Identify the source of your stress and your reaction to it. See a therapist who can help you change unhelpful reactions. When there are stressful events: Talk about them with family, friends, or coworkers. Try to think realistically about stressful events and not ignore them or overreact. Try to find the positives in a stressful situation and not focus on the negatives. Cut back on responsibilities at work and home, if possible. Ask for help from friends or family members if you need it. Find ways to manage stress, such as: Mindfulness, meditation, or deep breathing. Yoga or tai chi. Progressive muscle relaxation. Spending time in nature. Doing art, playing music, or reading. Making time for fun activities. Spending time with family and friends. Get support from family, friends, or spiritual resources. General instructions Get enough sleep. Try to go to sleep and get up at about the same time every day. Take over-the-counter  and prescription medicines only as told by your health care provider. Do not use any products that contain nicotine or tobacco. These products include cigarettes, chewing tobacco, and vaping devices, such as e-cigarettes. If you need help quitting, ask your health care provider. Do not use drugs or smoke to deal with stress. Keep all follow-up visits. This is important. Where to find support Talk with your health care provider about stress management or finding a support group. Find a therapist to work with you on your stress management techniques. Where to find more information The [employer] on Mental Illness: www.nami.org American Psychological Association: dicetournament.ca Contact a health care provider if: Your stress symptoms get worse. You are unable to manage your stress at home. You are struggling to stop using drugs or alcohol. Get help right away if: You may be a danger to yourself or others. You have any thoughts of death or suicide. Get help right awayif you feel like you may hurt yourself or others, or have thoughts about taking your own life. Go to your nearest emergency room or: Call 911. Call the National Suicide Prevention Lifeline at (501)148-3803 or 988 in the [place] This is open 24 hours a day. If youre a Veteran: Call 988 and press 1. This is open 24 hours a day. Text the [employer] at 865-699-8974. Summary Feeling  a certain amount of stress is normal, but severe or long-term (chronic) stress can affect your mental and physical health. Chronic stress can put you at higher risk for anxiety, depression, and other health problems such as digestive problems, muscle aches, heart disease, high blood pressure, and stroke. You may be at higher risk for stress-related problems if you do not get enough sleep, are in poor health, lack emotional support, or have a mental health disorder such as anxiety or depression. Identify the source of your stress and your reaction to it.  Try talking about stressful events with family, friends, or coworkers, finding a coping method, or getting support from spiritual resources. If you need more help, talk with your health care provider about finding a support group or a mental health therapist. This information is not intended to replace advice given to you by your health care provider. Make sure you discuss any questions you have with your health care provider. Document Revised: 10/24/2022 Document Reviewed: 10/03/2020 Elsevier Patient Education  2024 Elsevier Inc.    Signed,   Reyes Pines, MD [facility], [facility] 03/12/2024 11:12 AM      [1]  Allergies Allergen Reactions   Alphagan [Brimonidine] Itching, Swelling and Other (See Comments)    Watery eyes   Norethindrone  Other (See Comments)    Felt abnormal while taking   "

## 2024-03-12 NOTE — Patient Instructions (Signed)
 Thank you for coming in today.  I will check some lab work, no change in meds for now.  If cholesterol is still elevated we may need to consider a low-dose of a cholesterol medication but I will let you know.  Let me know if any persistent stress or anxiety symptoms and we certainly can discuss that further but as we discussed this time a year can sometimes be stressful.  I included some information below on managing stress but again happy to see you if the symptoms persist.  Take care!  Managing Stress, Adult Feeling a certain amount of stress is normal. Stress helps our body and mind get ready to deal with the demands of life. Stress hormones can motivate you to do well at work and meet your responsibilities. But severe or long-term (chronic) stress can affect your mental and physical health. Chronic stress puts you at higher risk for: Anxiety and depression. Other health problems such as digestive problems, muscle aches, heart disease, high blood pressure, and stroke. What are the causes? Common causes of stress include: Demands from work, such as deadlines, feeling overworked, or having long hours. Pressures at home, such as money issues, disagreements with a spouse, or parenting issues. Pressures from major life changes, such as divorce, moving, loss of a loved one, or chronic illness. You may be at higher risk for stress-related problems if you: Do not get enough sleep. Are in poor health. Do not have emotional support. Have a mental health disorder such as anxiety or depression. How to recognize stress Stress can make you: Have trouble sleeping. Feel sad, anxious, irritable, or overwhelmed. Lose your appetite. Overeat or want to eat unhealthy foods. Want to use drugs or alcohol. Stress can also cause physical symptoms, such as: Sore, tense muscles, especially in the shoulders and neck. Headaches. Trouble breathing. A faster heart rate. Stomach pain, nausea, or vomiting. Diarrhea  or constipation. Trouble concentrating. Follow these instructions at home: Eating and drinking Eat a healthy diet. This includes: Eating foods that are high in fiber, such as beans, whole grains, and fresh fruits and vegetables. Limiting foods that are high in fat and processed sugars, such as fried or sweet foods. Do not skip meals or overeat. Drink enough fluid to keep your urine pale yellow. Alcohol use Do not drink alcohol if: Your health care provider tells you not to drink. You are pregnant, may be pregnant, or are planning to become pregnant. Drinking alcohol is a way some people try to ease their stress. This can be dangerous, so if you drink alcohol: Limit how much you have to: 0-1 drink a day for women. 0-2 drinks a day for men. Know how much alcohol is in your drink. In the [place] one drink equals one 12 oz bottle of beer (355 mL), one 5 oz glass of wine (148 mL), or one 1 oz glass of hard liquor (44 mL). Activity  Include 30 minutes of exercise in your daily schedule. Exercise is a good stress reducer. Include time in your day for an activity that you find relaxing. Try taking a walk, going on a bike ride, reading a book, or listening to music. Schedule your time in a way that lowers stress, and keep a regular schedule. Focus on doing what is most important to get done. Lifestyle Identify the source of your stress and your reaction to it. See a therapist who can help you change unhelpful reactions. When there are stressful events: Talk about them with family,  friends, or coworkers. Try to think realistically about stressful events and not ignore them or overreact. Try to find the positives in a stressful situation and not focus on the negatives. Cut back on responsibilities at work and home, if possible. Ask for help from friends or family members if you need it. Find ways to manage stress, such as: Mindfulness, meditation, or deep breathing. Yoga or tai chi. Progressive  muscle relaxation. Spending time in nature. Doing art, playing music, or reading. Making time for fun activities. Spending time with family and friends. Get support from family, friends, or spiritual resources. General instructions Get enough sleep. Try to go to sleep and get up at about the same time every day. Take over-the-counter and prescription medicines only as told by your health care provider. Do not use any products that contain nicotine or tobacco. These products include cigarettes, chewing tobacco, and vaping devices, such as e-cigarettes. If you need help quitting, ask your health care provider. Do not use drugs or smoke to deal with stress. Keep all follow-up visits. This is important. Where to find support Talk with your health care provider about stress management or finding a support group. Find a therapist to work with you on your stress management techniques. Where to find more information The [employer] on Mental Illness: www.nami.org American Psychological Association: dicetournament.ca Contact a health care provider if: Your stress symptoms get worse. You are unable to manage your stress at home. You are struggling to stop using drugs or alcohol. Get help right away if: You may be a danger to yourself or others. You have any thoughts of death or suicide. Get help right awayif you feel like you may hurt yourself or others, or have thoughts about taking your own life. Go to your nearest emergency room or: Call 911. Call the National Suicide Prevention Lifeline at (684)732-3995 or 988 in the [place] This is open 24 hours a day. If youre a Veteran: Call 988 and press 1. This is open 24 hours a day. Text the [employer] at 310-498-1437. Summary Feeling a certain amount of stress is normal, but severe or long-term (chronic) stress can affect your mental and physical health. Chronic stress can put you at higher risk for anxiety, depression, and other health problems  such as digestive problems, muscle aches, heart disease, high blood pressure, and stroke. You may be at higher risk for stress-related problems if you do not get enough sleep, are in poor health, lack emotional support, or have a mental health disorder such as anxiety or depression. Identify the source of your stress and your reaction to it. Try talking about stressful events with family, friends, or coworkers, finding a coping method, or getting support from spiritual resources. If you need more help, talk with your health care provider about finding a support group or a mental health therapist. This information is not intended to replace advice given to you by your health care provider. Make sure you discuss any questions you have with your health care provider. Document Revised: 10/24/2022 Document Reviewed: 10/03/2020 Elsevier Patient Education  2024 [employer].

## 2024-03-15 ENCOUNTER — Encounter: Payer: Self-pay | Admitting: Family Medicine

## 2024-03-15 ENCOUNTER — Ambulatory Visit: Payer: Self-pay | Admitting: Family Medicine

## 2024-03-19 ENCOUNTER — Ambulatory Visit: Admitting: Family Medicine

## 2024-03-22 ENCOUNTER — Telehealth: Payer: Self-pay | Admitting: Adult Health

## 2024-03-22 NOTE — Telephone Encounter (Signed)
 LVM and MyChart message informing pt of reschedule needed for 01/06/25- NP out

## 2024-04-05 ENCOUNTER — Encounter: Payer: Self-pay | Admitting: Adult Health

## 2024-04-05 DIAGNOSIS — G4733 Obstructive sleep apnea (adult) (pediatric): Secondary | ICD-10-CM

## 2024-04-05 DIAGNOSIS — Z9189 Other specified personal risk factors, not elsewhere classified: Secondary | ICD-10-CM

## 2024-04-05 DIAGNOSIS — R351 Nocturia: Secondary | ICD-10-CM

## 2024-04-05 DIAGNOSIS — G4719 Other hypersomnia: Secondary | ICD-10-CM

## 2024-04-05 DIAGNOSIS — R0689 Other abnormalities of breathing: Secondary | ICD-10-CM

## 2024-04-05 DIAGNOSIS — E66811 Obesity, class 1: Secondary | ICD-10-CM

## 2024-04-05 DIAGNOSIS — R0683 Snoring: Secondary | ICD-10-CM

## 2024-04-06 NOTE — Telephone Encounter (Signed)
 SABRA

## 2024-04-12 NOTE — Telephone Encounter (Signed)
 SABRA

## 2024-04-13 ENCOUNTER — Other Ambulatory Visit (HOSPITAL_BASED_OUTPATIENT_CLINIC_OR_DEPARTMENT_OTHER): Payer: Self-pay

## 2024-04-13 DIAGNOSIS — N912 Amenorrhea, unspecified: Secondary | ICD-10-CM

## 2024-04-15 ENCOUNTER — Other Ambulatory Visit (HOSPITAL_BASED_OUTPATIENT_CLINIC_OR_DEPARTMENT_OTHER): Payer: Self-pay

## 2024-04-15 DIAGNOSIS — N912 Amenorrhea, unspecified: Secondary | ICD-10-CM

## 2024-04-15 DIAGNOSIS — Z9889 Other specified postprocedural states: Secondary | ICD-10-CM

## 2024-04-15 DIAGNOSIS — N926 Irregular menstruation, unspecified: Secondary | ICD-10-CM

## 2024-04-15 LAB — THYROID PANEL WITH TSH
Free Thyroxine Index: 1.8 (ref 1.2–4.9)
T3 Uptake Ratio: 26 % (ref 24–39)
T4, Total: 6.9 ug/dL (ref 4.5–12.0)
TSH: 0.942 u[IU]/mL (ref 0.450–4.500)

## 2024-04-15 LAB — FOLLICLE STIMULATING HORMONE: FSH: 43.1 m[IU]/mL

## 2024-04-15 LAB — PROLACTIN: Prolactin: 9.2 ng/mL (ref 4.8–33.4)

## 2024-04-15 LAB — ESTRADIOL: Estradiol: <5 pg/mL

## 2024-04-16 ENCOUNTER — Ambulatory Visit (HOSPITAL_BASED_OUTPATIENT_CLINIC_OR_DEPARTMENT_OTHER): Payer: Self-pay | Admitting: Obstetrics & Gynecology

## 2024-04-16 DIAGNOSIS — E1165 Type 2 diabetes mellitus with hyperglycemia: Secondary | ICD-10-CM

## 2024-04-16 MED ORDER — FREESTYLE LIBRE 3 PLUS SENSOR MISC: 2 refills | Status: AC

## 2024-04-16 NOTE — Telephone Encounter (Signed)
 Request received from [facility] health care, aid copies of most recent office visits and printed prescription for her CGM monitors.  Printed and Paperwork completed and placed in fax bin at back nurse station

## 2024-04-19 NOTE — Progress Notes (Signed)
"   04/16/24  4:27 PM Note Request received from [facility] health care, aid copies of most recent office visits and printed prescription for her CGM monitors.  Printed and Paperwork completed and placed in fax bin at back nurse station    "

## 2024-04-20 ENCOUNTER — Telehealth: Payer: Self-pay | Admitting: Family Medicine

## 2024-04-20 ENCOUNTER — Telehealth: Payer: Self-pay

## 2024-04-20 NOTE — Telephone Encounter (Signed)
 Type of form received: [facility]  Additional comments:   Received by: Fax  Form should be Faxed/mailed to: (address/ fax #) [phone]  Is patient requesting call for pickup:  Form placed: provider bin   Attach charge sheet.  Provider will determine charge.  Individual made aware of 3-5 business day turn around Yes?

## 2024-04-20 NOTE — Telephone Encounter (Signed)
 Faxed back.

## 2024-04-20 NOTE — Telephone Encounter (Signed)
 Copied from CRM #[id number]. Topic: General - Other >> Apr 20, 2024  1:22 PM Mesmerise C wrote: Reason for CRM: Candis from [employer] needing patient's last chart notes can be faxed to [phone] and wanting to know when the next upcoming appt is

## 2024-04-20 NOTE — Progress Notes (Signed)
 I do not see anything in the bin at nurse station

## 2024-04-20 NOTE — Progress Notes (Signed)
 Nothing in scan folder or media folder as well

## 2024-04-21 NOTE — Telephone Encounter (Signed)
 This has already been filled out and faxed back twice at this time most recent being afternoon on 04/20/2024

## 2024-04-26 ENCOUNTER — Encounter: Payer: Self-pay | Admitting: Family Medicine

## 2024-06-11 ENCOUNTER — Ambulatory Visit: Admitting: Family Medicine

## 2024-06-18 ENCOUNTER — Ambulatory Visit: Admitting: Family Medicine

## 2024-06-21 ENCOUNTER — Ambulatory Visit: Admitting: Family Medicine

## 2025-01-06 ENCOUNTER — Ambulatory Visit: Admitting: Adult Health
